# Patient Record
Sex: Female | Born: 1980 | Race: Black or African American | Hispanic: No | Smoking: Current some day smoker
Health system: Southern US, Community
[De-identification: ages and names within clinical notes are randomized; demographics above are authoritative.]

## PROBLEM LIST (undated history)

## (undated) DIAGNOSIS — R4701 Aphasia: Secondary | ICD-10-CM

## (undated) DIAGNOSIS — F419 Anxiety disorder, unspecified: Secondary | ICD-10-CM

## (undated) DIAGNOSIS — I10 Essential (primary) hypertension: Secondary | ICD-10-CM

## (undated) DIAGNOSIS — Z872 Personal history of diseases of the skin and subcutaneous tissue: Secondary | ICD-10-CM

## (undated) DIAGNOSIS — N809 Endometriosis, unspecified: Secondary | ICD-10-CM

## (undated) HISTORY — DX: Essential (primary) hypertension: I10

## (undated) HISTORY — DX: Personal history of diseases of the skin and subcutaneous tissue: Z87.2

## (undated) HISTORY — DX: Endometriosis, unspecified: N80.9

---

## 2004-02-17 ENCOUNTER — Emergency Department: Payer: Self-pay | Admitting: Emergency Medicine

## 2004-02-17 ENCOUNTER — Other Ambulatory Visit: Payer: Self-pay

## 2004-08-14 ENCOUNTER — Emergency Department: Payer: Self-pay | Admitting: Internal Medicine

## 2004-08-14 ENCOUNTER — Other Ambulatory Visit: Payer: Self-pay

## 2005-04-05 ENCOUNTER — Ambulatory Visit (HOSPITAL_COMMUNITY): Admission: RE | Admit: 2005-04-05 | Discharge: 2005-04-05 | Payer: Self-pay | Admitting: Gynecology

## 2005-04-09 HISTORY — PX: OTHER SURGICAL HISTORY: SHX169

## 2005-05-10 ENCOUNTER — Emergency Department: Payer: Self-pay | Admitting: Emergency Medicine

## 2006-04-06 ENCOUNTER — Emergency Department: Payer: Self-pay | Admitting: Emergency Medicine

## 2006-04-18 ENCOUNTER — Ambulatory Visit: Payer: Self-pay | Admitting: Gynecology

## 2006-04-18 ENCOUNTER — Ambulatory Visit (HOSPITAL_COMMUNITY): Admission: RE | Admit: 2006-04-18 | Discharge: 2006-04-18 | Payer: Self-pay | Admitting: Obstetrics & Gynecology

## 2006-05-15 ENCOUNTER — Ambulatory Visit: Payer: Self-pay | Admitting: Obstetrics & Gynecology

## 2006-05-15 ENCOUNTER — Encounter (INDEPENDENT_AMBULATORY_CARE_PROVIDER_SITE_OTHER): Payer: Self-pay | Admitting: Gynecology

## 2006-06-03 ENCOUNTER — Ambulatory Visit (HOSPITAL_COMMUNITY): Admission: RE | Admit: 2006-06-03 | Discharge: 2006-06-03 | Payer: Self-pay | Admitting: Gynecology

## 2006-06-17 ENCOUNTER — Ambulatory Visit: Payer: Self-pay | Admitting: Gynecology

## 2006-07-01 ENCOUNTER — Ambulatory Visit (HOSPITAL_COMMUNITY): Admission: RE | Admit: 2006-07-01 | Discharge: 2006-07-01 | Payer: Self-pay | Admitting: Gynecology

## 2006-07-15 ENCOUNTER — Ambulatory Visit (HOSPITAL_COMMUNITY): Admission: RE | Admit: 2006-07-15 | Discharge: 2006-07-15 | Payer: Self-pay | Admitting: Gynecology

## 2006-07-17 ENCOUNTER — Ambulatory Visit: Payer: Self-pay | Admitting: Obstetrics & Gynecology

## 2006-08-06 ENCOUNTER — Ambulatory Visit: Payer: Self-pay | Admitting: Obstetrics & Gynecology

## 2006-08-13 ENCOUNTER — Ambulatory Visit (HOSPITAL_COMMUNITY): Admission: RE | Admit: 2006-08-13 | Discharge: 2006-08-13 | Payer: Self-pay | Admitting: Gynecology

## 2006-08-25 ENCOUNTER — Ambulatory Visit (HOSPITAL_COMMUNITY): Admission: AD | Admit: 2006-08-25 | Discharge: 2006-08-25 | Payer: Self-pay | Admitting: Obstetrics and Gynecology

## 2006-08-26 ENCOUNTER — Ambulatory Visit: Payer: Self-pay | Admitting: Gynecology

## 2006-09-17 ENCOUNTER — Ambulatory Visit: Payer: Self-pay | Admitting: Family Medicine

## 2006-09-19 ENCOUNTER — Ambulatory Visit: Payer: Self-pay | Admitting: Family Medicine

## 2006-10-03 ENCOUNTER — Ambulatory Visit: Payer: Self-pay | Admitting: Obstetrics & Gynecology

## 2006-10-16 ENCOUNTER — Ambulatory Visit: Payer: Self-pay | Admitting: Obstetrics & Gynecology

## 2006-11-06 ENCOUNTER — Ambulatory Visit: Payer: Self-pay | Admitting: Obstetrics & Gynecology

## 2006-11-14 ENCOUNTER — Ambulatory Visit: Payer: Self-pay | Admitting: Obstetrics & Gynecology

## 2006-11-18 ENCOUNTER — Ambulatory Visit: Payer: Self-pay | Admitting: Gynecology

## 2006-11-27 ENCOUNTER — Ambulatory Visit: Payer: Self-pay | Admitting: Obstetrics & Gynecology

## 2006-11-30 ENCOUNTER — Ambulatory Visit: Payer: Self-pay | Admitting: Physician Assistant

## 2006-11-30 ENCOUNTER — Inpatient Hospital Stay (HOSPITAL_COMMUNITY): Admission: AD | Admit: 2006-11-30 | Discharge: 2006-12-02 | Payer: Self-pay | Admitting: Gynecology

## 2007-07-02 ENCOUNTER — Ambulatory Visit: Payer: Self-pay | Admitting: Gynecology

## 2007-07-03 ENCOUNTER — Ambulatory Visit: Payer: Self-pay | Admitting: Gynecology

## 2007-07-03 ENCOUNTER — Ambulatory Visit (HOSPITAL_COMMUNITY): Admission: RE | Admit: 2007-07-03 | Discharge: 2007-07-03 | Payer: Self-pay | Admitting: Gynecology

## 2007-09-24 ENCOUNTER — Inpatient Hospital Stay (HOSPITAL_COMMUNITY): Admission: AD | Admit: 2007-09-24 | Discharge: 2007-09-24 | Payer: Self-pay | Admitting: Family Medicine

## 2007-10-06 ENCOUNTER — Ambulatory Visit: Payer: Self-pay | Admitting: Gynecology

## 2007-11-11 ENCOUNTER — Emergency Department: Payer: Self-pay

## 2007-11-13 ENCOUNTER — Emergency Department: Payer: Self-pay | Admitting: Emergency Medicine

## 2008-03-11 ENCOUNTER — Encounter: Payer: Self-pay | Admitting: Family Medicine

## 2008-03-11 ENCOUNTER — Ambulatory Visit: Payer: Self-pay | Admitting: Obstetrics & Gynecology

## 2008-03-11 LAB — CONVERTED CEMR LAB
Basophils Absolute: 0 10*3/uL (ref 0.0–0.1)
Hemoglobin: 12.1 g/dL (ref 12.0–15.0)
Hepatitis B Surface Ag: NEGATIVE
Lymphocytes Relative: 34 % (ref 12–46)
Monocytes Relative: 5 % (ref 3–12)
Neutrophils Relative %: 59 % (ref 43–77)
Platelets: 305 10*3/uL (ref 150–400)
Rh Type: POSITIVE
WBC: 8.3 10*3/uL (ref 4.0–10.5)

## 2008-03-12 ENCOUNTER — Ambulatory Visit (HOSPITAL_COMMUNITY): Admission: RE | Admit: 2008-03-12 | Discharge: 2008-03-12 | Payer: Self-pay | Admitting: Obstetrics & Gynecology

## 2008-03-23 ENCOUNTER — Ambulatory Visit: Payer: Self-pay | Admitting: Obstetrics and Gynecology

## 2008-04-20 ENCOUNTER — Ambulatory Visit: Payer: Self-pay | Admitting: Family Medicine

## 2008-04-27 ENCOUNTER — Ambulatory Visit: Payer: Self-pay | Admitting: Obstetrics and Gynecology

## 2008-05-16 ENCOUNTER — Emergency Department: Payer: Self-pay | Admitting: Emergency Medicine

## 2008-05-17 ENCOUNTER — Ambulatory Visit: Payer: Self-pay | Admitting: Obstetrics and Gynecology

## 2008-06-01 ENCOUNTER — Ambulatory Visit (HOSPITAL_COMMUNITY): Admission: RE | Admit: 2008-06-01 | Discharge: 2008-06-01 | Payer: Self-pay | Admitting: Family Medicine

## 2008-06-07 ENCOUNTER — Inpatient Hospital Stay (HOSPITAL_COMMUNITY): Admission: AD | Admit: 2008-06-07 | Discharge: 2008-06-07 | Payer: Self-pay | Admitting: Obstetrics & Gynecology

## 2008-06-09 ENCOUNTER — Encounter: Payer: Self-pay | Admitting: Family Medicine

## 2008-06-09 ENCOUNTER — Ambulatory Visit: Payer: Self-pay | Admitting: Obstetrics & Gynecology

## 2008-07-06 ENCOUNTER — Ambulatory Visit: Payer: Self-pay | Admitting: Obstetrics and Gynecology

## 2008-07-11 ENCOUNTER — Ambulatory Visit: Payer: Self-pay | Admitting: Family Medicine

## 2008-07-11 ENCOUNTER — Inpatient Hospital Stay (HOSPITAL_COMMUNITY): Admission: AD | Admit: 2008-07-11 | Discharge: 2008-07-11 | Payer: Self-pay | Admitting: Family Medicine

## 2008-07-16 ENCOUNTER — Inpatient Hospital Stay (HOSPITAL_COMMUNITY): Admission: AD | Admit: 2008-07-16 | Discharge: 2008-07-16 | Payer: Self-pay | Admitting: Obstetrics & Gynecology

## 2008-08-04 ENCOUNTER — Ambulatory Visit: Payer: Self-pay | Admitting: Family Medicine

## 2008-08-04 LAB — CONVERTED CEMR LAB
HCT: 31.4 % — ABNORMAL LOW (ref 36.0–46.0)
Hemoglobin: 10.5 g/dL — ABNORMAL LOW (ref 12.0–15.0)
MCHC: 33.4 g/dL (ref 30.0–36.0)
Platelets: 259 10*3/uL (ref 150–400)
RBC: 3.38 M/uL — ABNORMAL LOW (ref 3.87–5.11)
RDW: 13.2 % (ref 11.5–15.5)
WBC: 11 10*3/uL — ABNORMAL HIGH (ref 4.0–10.5)

## 2008-08-18 ENCOUNTER — Ambulatory Visit: Payer: Self-pay | Admitting: Obstetrics & Gynecology

## 2008-09-04 ENCOUNTER — Inpatient Hospital Stay (HOSPITAL_COMMUNITY): Admission: AD | Admit: 2008-09-04 | Discharge: 2008-09-04 | Payer: Self-pay | Admitting: Obstetrics & Gynecology

## 2008-09-04 ENCOUNTER — Ambulatory Visit: Payer: Self-pay | Admitting: Advanced Practice Midwife

## 2008-09-07 ENCOUNTER — Ambulatory Visit: Payer: Self-pay | Admitting: Obstetrics & Gynecology

## 2008-09-09 ENCOUNTER — Ambulatory Visit (HOSPITAL_COMMUNITY): Admission: RE | Admit: 2008-09-09 | Discharge: 2008-09-09 | Payer: Self-pay | Admitting: Obstetrics & Gynecology

## 2008-09-13 ENCOUNTER — Ambulatory Visit: Payer: Self-pay | Admitting: Obstetrics & Gynecology

## 2008-09-27 ENCOUNTER — Inpatient Hospital Stay (HOSPITAL_COMMUNITY): Admission: AD | Admit: 2008-09-27 | Discharge: 2008-09-27 | Payer: Self-pay | Admitting: Obstetrics & Gynecology

## 2008-09-28 ENCOUNTER — Encounter: Payer: Self-pay | Admitting: Obstetrics and Gynecology

## 2008-09-28 ENCOUNTER — Ambulatory Visit: Payer: Self-pay | Admitting: Obstetrics & Gynecology

## 2008-09-28 LAB — CONVERTED CEMR LAB: Chlamydia, DNA Probe: NEGATIVE

## 2008-09-29 ENCOUNTER — Encounter: Payer: Self-pay | Admitting: Obstetrics and Gynecology

## 2008-09-30 ENCOUNTER — Ambulatory Visit: Payer: Self-pay | Admitting: Obstetrics & Gynecology

## 2008-10-04 ENCOUNTER — Ambulatory Visit (HOSPITAL_COMMUNITY): Admission: RE | Admit: 2008-10-04 | Discharge: 2008-10-04 | Payer: Self-pay | Admitting: Family Medicine

## 2008-10-06 ENCOUNTER — Inpatient Hospital Stay (HOSPITAL_COMMUNITY): Admission: AD | Admit: 2008-10-06 | Discharge: 2008-10-08 | Payer: Self-pay | Admitting: Obstetrics & Gynecology

## 2008-10-06 ENCOUNTER — Ambulatory Visit: Payer: Self-pay | Admitting: Family

## 2008-10-06 DIAGNOSIS — IMO0002 Reserved for concepts with insufficient information to code with codable children: Secondary | ICD-10-CM

## 2010-07-17 LAB — CBC
HCT: 32 % — ABNORMAL LOW (ref 36.0–46.0)
Hemoglobin: 11.2 g/dL — ABNORMAL LOW (ref 12.0–15.0)
MCV: 94.6 fL (ref 78.0–100.0)
RDW: 13.3 % (ref 11.5–15.5)
WBC: 12.6 10*3/uL — ABNORMAL HIGH (ref 4.0–10.5)

## 2010-07-19 LAB — URINE CULTURE: Colony Count: 8000

## 2010-07-19 LAB — URINALYSIS, ROUTINE W REFLEX MICROSCOPIC
Bilirubin Urine: NEGATIVE
Glucose, UA: NEGATIVE mg/dL
Hgb urine dipstick: NEGATIVE
Ketones, ur: NEGATIVE mg/dL
Leukocytes, UA: NEGATIVE
Nitrite: NEGATIVE
Urobilinogen, UA: 1 mg/dL (ref 0.0–1.0)

## 2010-07-19 LAB — URINE MICROSCOPIC-ADD ON

## 2010-07-19 LAB — WET PREP, GENITAL: Trich, Wet Prep: NONE SEEN

## 2010-07-19 LAB — GC/CHLAMYDIA PROBE AMP, GENITAL: Chlamydia, DNA Probe: NEGATIVE

## 2010-07-20 LAB — CBC
HCT: 34 % — ABNORMAL LOW (ref 36.0–46.0)
Hemoglobin: 11.4 g/dL — ABNORMAL LOW (ref 12.0–15.0)
MCHC: 33.7 g/dL (ref 30.0–36.0)
MCV: 93.2 fL (ref 78.0–100.0)
Platelets: 255 10*3/uL (ref 150–400)
RDW: 12.8 % (ref 11.5–15.5)

## 2010-07-20 LAB — WET PREP, GENITAL

## 2010-07-20 LAB — URINE CULTURE: Colony Count: 25000

## 2010-07-20 LAB — URINALYSIS, ROUTINE W REFLEX MICROSCOPIC
Bilirubin Urine: NEGATIVE
Hgb urine dipstick: NEGATIVE
Ketones, ur: NEGATIVE mg/dL
Nitrite: NEGATIVE
Protein, ur: NEGATIVE mg/dL
Urobilinogen, UA: 0.2 mg/dL (ref 0.0–1.0)

## 2010-07-20 LAB — URINE MICROSCOPIC-ADD ON

## 2010-08-22 NOTE — Assessment & Plan Note (Signed)
Kara Brooks, LOSE NO.:  [id number]   MEDICAL RECORD NO.:  [id number]          PATIENT TYPE:  POB   LOCATION:  [facility] at [facility]         FACILITY:  [facility]   PHYSICIAN:  Ginger Carne, MD DATE OF BIRTH:  October 08, 1980   DATE OF SERVICE:  10/06/2007                                  CLINIC NOTE   Ms. Hagenow is a 30 year old African American female who has been on Depo-  Provera since August 2008 after delivery of her last child.  She has  actually been on said form of contraception for years prior to her  having a child.  Over the past 25 days, she has experienced scant  vaginal bleeding described as old dark blood and requiring a panty  liner.  She has not had this problem in the past.   SALIENT PHYSICAL FINDINGS:  VITAL SIGNS: Blood pressure 132/82, weight  174 pounds, height 5 feet 6 inches, and pulse 79 and regular.  EXTERNAL GENITALIA: Vulva and vagina normal.  Cervix smooth without  erosions or lesions.  Uterus is small, anteverted, and flexed.  Both  adnexa are palpable and found to be normal.  Urine pregnancy test  negative.   IMPRESSION:  Abnormal uterine bleeding secondary to Depo-Provera  injection.   PLAN:  The patient received her last injection in April 2009.  She  states that she does not wish to continue said medication, and she  wishes to try to have another child in the future.  I have prescribed  Premarin 1.25 mg daily for 7 days.  It was explained to the patient that  this may not completely control her bleeding and it is possible she will  have scant flow off and on over the next month or two.  She also  understands that although the contraceptive effect of Depo-Provera is 3  months, the actual side effects including spotting may last up to 9  months to a year.  The patient understands this and will try Premarin.           ______________________________  Ginger Carne, MD     SHB/MEDQ  D:  10/06/2007  T:  10/07/2007  Job:   [id number]

## 2010-08-22 NOTE — Op Note (Signed)
Kara Brooks, Kara Brooks              ACCOUNT NO.:  [id number]   MEDICAL RECORD NO.:  [id number]          PATIENT TYPE:  AMB   LOCATION:  [facility]                           [facility]ACILITY:  [facility]   PHYSICIAN:  Ginger Carne, MD  DATE OF BIRTH:  [birth year]   DATE OF PROCEDURE:  07/03/2007  DATE OF DISCHARGE:                               OPERATIVE REPORT   PREOPERATIVE DIAGNOSES:  Right upper groin abscess.   POSTOPERATIVE DIAGNOSES:  Right upper groin abscess.   PROCEDURE:  Incision and drainage of right groin abscess.   SURGEON:  Ginger Carne, MD.   ASSISTANT:  None.   COMPLICATIONS:  None immediate.   ESTIMATED BLOOD LOSS:  Minimal.   SPECIMEN:  Aerobic and anaerobic cultures.   ANESTHESIA:  MAC.   OPERATIVE FINDINGS:  A 4 x 2 cm oblong right upper groin abscess which  demonstrated purulent and sebaceous material.   DESCRIPTION OF PROCEDURE:  The patient prepped and draped in the usual  fashion and placed in lithotomy position.  Betadine solution used for  antiseptic and the patient was not catheterized.  After adequate MAC  anesthesia, an incision was made in the abscess site. Purulent and  sebaceous material was obtained and cultured for anaerobic and aerobic  bacteria. Inspection of the abscess with a Kelly clamp was performed to  assure that all infective material was removed.  Minimal bleeding noted  at the end of the procedure.  The patient tolerated procedure the  procedure well and returned to the post anesthesia recovery room in  excellent condition.      Ginger Carne, MD  Electronically Signed     SHB/MEDQ  D:  07/03/2007  T:  07/04/2007  Job:  [id number]

## 2010-08-22 NOTE — H&P (Signed)
NAMEKENZLIE, DISCH              ACCOUNT NO.:  [id number]   MEDICAL RECORD NO.:  [id number]          PATIENT TYPE:  POB   LOCATION:  [facility]                          FACILITY:  [facility]   PHYSICIAN:  Ginger Carne, MD  DATE OF BIRTH:  18-Sep-1980   DATE OF ADMISSION:  07/02/2007  DATE OF DISCHARGE:                              HISTORY & PHYSICAL   Surgery scheduled July 03, 2007.   REASON FOR HOSPITALIZATION:  Right groin abscess.   HISTORY OF PRESENT ILLNESS:  This patient is a 30 year old gravida 1,  para 1 African American female who has had, by history, presumptive  diagnosis of hidradenitis suppurativa.  Over the past month she has had  an enlarging and tender upper right inner thigh caruncle, which is not  spontaneously drained.   OB/GYN HISTORY:  The patient has had 1 vaginal delivery in the past.   PAST SURGICAL HISTORY:  She has had a operative laparoscopy in December  2006.   PAST MEDICAL HISTORY:  None.   ALLERGIES:  METRONIDAZOLE which causes nausea and vomiting.   CURRENT MEDICATIONS:  [place] vitamins, Depo Provera q. 3 months as  needed for contraception.   SOCIAL HISTORY:  The patient smokes 1/2 to 1 pack of cigarettes daily.  Denies alcohol or illicit drug abuse.   FAMILY HISTORY:  No first-degree relatives with breast, colon, ovarian,  or uterine carcinoma.  Her mother has hypertension.   REVIEW OF SYSTEMS:  A 14 point comprehensive review of systems within  normal limits.   PHYSICAL EXAMINATION:  Blood pressure 139/86, weight 174 pounds, height  5 feet 6 inches.  HEENT: Grossly normal.  Breast exam without masses, discharge, thickenings or tenderness.  CHEST:  Clear to percussion and auscultation.  CARDIOVASCULAR:  Exam without murmurs or enlargements.  Regular rate and  rhythm.  EXTREMITY, LYMPHATIC SKIN, NEUROLOGICAL, MUSCULOSKELETAL:  Systems all  within normal limits.  ABDOMEN:  Soft without gross hepatosplenomegaly.  PELVIC: Exam, external  genitalia, vulva and vagina reveals a 4x2 oblong  caruncle in the upper inner right thigh.  It is tender on palpation and  fluctuant.  The patient demonstrates previous scarring from a caruncles.  Uterus small, anteverted, flexed.  Both adnexa palpable found to be  normal.   IMPRESSION:  Right inner thigh caruncle.   PLAN:  The patient declined in office incision and drainage under local,  and is therefore to be admitted under short-stay status for incision and  drainage of right inner thigh, caruncle, anaerobic and aerobic cultures  will be obtained and the patient will be placed afterwards on Bactrim  double strength 1 twice a day for 14 days.      Ginger Carne, MD  Electronically Signed     SHB/MEDQ  D:  07/02/2007  T:  07/02/2007  Job:  [id number]

## 2010-08-25 NOTE — H&P (Signed)
Kara Brooks, TRANCHINA              ACCOUNT NO.:  [id number]   MEDICAL RECORD NO.:  [id number]          PATIENT TYPE:  AMB   LOCATION:  [facility]                           [facility]AC[facility]   PHYSICIAN:  Ginger Carne, MD  DATE OF BIRTH:  01/30/1981   DATE OF ADMISSION:  04/05/2005  DATE OF DISCHARGE:                                HISTORY & PHYSICAL   REASON FOR HOSPITALIZATION:  Severe dysmenorrhea and chronic pelvic pain.   IN-HOSPITAL PROCEDURES:  Operative laparoscopy and hydrotubation.   HISTORY OF PRESENT ILLNESS:  This is a 30 year old nulligravida female  admitted for an operative laparoscopy and hydrotubation because of  significant dysmenorrhea and chronic pelvic pain with dyspareunia.  The  patient had utilized Depo Provera over time which had minimized her  discomfort.  Since 2004, however, she has been off Depo Provera.  Her  discomfort has been managed with nonsteroidal anti-inflammatory agents with  modest relief.  The patient states that she has significant discomfort  lasting through her menses in addition to intramenstrual cramping and  occasional dyspareunia.  She has utilized Vicodin for discomfort which has  helped her deal with her pain.  The patient has no gastrointestinal,  genitourinary or musculoskeletal sources for her discomfort.   Discomfort has occurred over the past three to four years, but has worsened  significantly over the past six to eight months.   OB/GYN HISTORY:  The patient has never been pregnant and has had no prior  gynecological surgery.   ALLERGIES:  None.   CURRENT MEDICATIONS:  Lo-Estrin 24 oral contraceptives and Vicodin.   SOCIAL HISTORY:  Patient smokes about half pack of cigarettes daily.  Denies  alcohol or drug abuse.   FAMILY HISTORY:  Mother has hypertension.   REVIEW OF SYSTEMS:  Negative.   MEDICAL HISTORY:  Negative.   SURGICAL HISTORY:  Negative.   PHYSICAL EXAMINATION:  VITAL SIGNS:  Blood pressure is 129/84,  height 5 feet  7 inches, weight 169 pounds.  HEENT:  Grossly normal.  CHEST:  Clear to auscultation, percussion.  CARDIAC:  Without murmurs or enlargements.  Regular rate and rhythm.  BREASTS:  Without masses, discharge, thickenings, or tenderness.  EXTREMITIES:  Normal.  LYMPHATIC:  Normal.  NEUROLOGIC:  Normal.  MUSCULOSKELETAL:  Normal.  ABDOMEN:  Soft without gross hepatosplenomegaly.  PELVIC:  Normal Pap smear.  Urinalysis negative.  External genitalia, vulva,  vagina normal.  Cervix smooth without erosions or lesions.  Uterus is small,  anteverted, and flexed.  No nodularity in the cul-de-sac noted.  Both adnexa  palpable, found to be normal.  RECTAL:  Hemoccult-negative without masses.   IMPRESSION:  Chronic dysmenorrhea with dyspareunia and intramenstrual  cramping.   PLAN:  Patient will undergo an operative laparoscopy with hydrotubation to  assess the etiology of her pelvic pain.  Ashby Dawes of said procedure discussed  in detail with the patient.      Ginger Carne, MD  Electronically Signed     SHB/MEDQ  D:  04/04/2005  T:  04/04/2005  Job:  [id number]

## 2010-08-25 NOTE — Op Note (Signed)
Kara Brooks, Kara Brooks              ACCOUNT NO.:  [id number]   MEDICAL RECORD NO.:  [id number]          PATIENT TYPE:  AMB   LOCATION:  [facility]                           [facility]   PHYSICIAN:  Ginger Carne, MD  DATE OF BIRTH:  1980/10/03   DATE OF PROCEDURE:  04/05/2005  DATE OF DISCHARGE:                                 OPERATIVE REPORT   PREOPERATIVE DIAGNOSIS:  Chronic pelvic pain.   POSTOPERATIVE DIAGNOSIS:  Stage III endometriosis, chronic pelvic  inflammatory disease, bilateral adnexal adhesions, and bilateral tubal  occlusion.   PROCEDURE:  Diagnostic laparoscopy, hydrotubation.   SURGEON:  Ginger Carne, M.D.   ASSISTANT:  None.   COMPLICATIONS:  None immediate.   ESTIMATED BLOOD LOSS:  Minimal.   SPECIMEN:  None.   ANESTHESIA:  General.   OPERATIVE FINDINGS:  External genitalia, vulva, vagina normal. Cervix smooth  without erosions or lesions. Laparoscopic evaluation revealed a normal  contoured uterus but with endometriotic flecks on the posterior aspect of  the uterus and cervix. Both adnexa were involved with adhesive disease and  endometriosis. Both tubes and ovaries had evidence for adhesive disease.  Hydrotubation revealed no evidence of spillage of dye through said tubes and  the end of the tubes appeared occluded. The left adnexa was significantly  adherent more so than the right. The appendix was similarly involved in  adhesive disease. Large and small bowel grossly normal. Upper abdomen  normal.   OPERATIVE PROCEDURE:  The patient prepped and draped in the usual fashion  and placed in lithotomy position. Betadine solution used for antiseptic and  the patient was catheterized prior to the procedure. After adequate general  anesthesia, tenaculum placed on the ends of the cervix and acorn tenaculum  placed in the endocervical canal. Following this a vertical infraumbilical  incision was made. The Veress needle placed in the abdomen. Opening  and  closing pressures were 10-50 mmHg. Needle released, trocar placed through  the same incision. Laparoscope placed through the trocar sleeve. The 5 mm  port was made in the left lower quadrant under direct visualization.  Inspection and photography of the abdomen and pelvis was conducted.  Hydrotubation performed. Afterwards gas released,  trocars removed. Closure of the 10 mm fascia site with 0 Vicryl suture and a  4-0 Vicryl for subcuticular closure. Instrument and sponge count were  correct. The patient tolerated procedure well, returned to post anesthesia  recovery room in excellent condition.      Ginger Carne, MD  Electronically Signed     SHB/MEDQ  D:  04/05/2005  T:  04/05/2005  Job:  [id number]

## 2011-01-01 LAB — WOUND CULTURE

## 2011-01-01 LAB — CBC
Hemoglobin: 13.1
MCV: 88.4
RBC: 4.29
WBC: 8.3

## 2011-01-01 LAB — BASIC METABOLIC PANEL
Calcium: 9.8
Chloride: 107
Creatinine, Ser: 0.69
GFR calc Af Amer: >60
Sodium: 140

## 2011-01-01 LAB — ANAEROBIC CULTURE

## 2011-01-19 LAB — URINALYSIS, DIPSTICK ONLY
Hgb urine dipstick: NEGATIVE
Leukocytes, UA: NEGATIVE
Protein, ur: NEGATIVE
Urobilinogen, UA: 1

## 2011-01-19 LAB — CBC
HCT: 29.4 — ABNORMAL LOW
Hemoglobin: 12.7
MCHC: 34.4
MCV: 92.6
MCV: 93.2
Platelets: 203
Platelets: 211
RBC: 3.96
WBC: 14.9 — ABNORMAL HIGH
WBC: 23.4 — ABNORMAL HIGH

## 2011-01-19 LAB — COMPREHENSIVE METABOLIC PANEL
Alkaline Phosphatase: 107
BUN: 6
CO2: 21
Chloride: 105
GFR calc non Af Amer: >60
Glucose, Bld: 75
Potassium: 3.7
Total Bilirubin: 0.6

## 2011-01-19 LAB — DIFFERENTIAL
Eosinophils Relative: 1
Lymphocytes Relative: 16
Lymphs Abs: 2.4

## 2011-01-19 LAB — URIC ACID: Uric Acid, Serum: 4.7

## 2011-01-19 LAB — LACTATE DEHYDROGENASE: LDH: 129

## 2011-03-06 ENCOUNTER — Emergency Department: Payer: Self-pay | Admitting: Emergency Medicine

## 2011-03-10 ENCOUNTER — Emergency Department: Payer: Self-pay | Admitting: Emergency Medicine

## 2012-04-07 ENCOUNTER — Emergency Department: Payer: Self-pay | Admitting: Emergency Medicine

## 2013-07-08 ENCOUNTER — Emergency Department: Payer: Self-pay | Admitting: Emergency Medicine

## 2013-07-11 LAB — BETA STREP CULTURE(ARMC)

## 2014-01-15 ENCOUNTER — Emergency Department: Payer: Self-pay | Admitting: Student

## 2014-01-16 ENCOUNTER — Emergency Department: Payer: Self-pay | Admitting: Emergency Medicine

## 2014-03-02 ENCOUNTER — Emergency Department: Payer: Self-pay | Admitting: Emergency Medicine

## 2014-04-09 DIAGNOSIS — Z872 Personal history of diseases of the skin and subcutaneous tissue: Secondary | ICD-10-CM

## 2014-04-09 HISTORY — PX: OTHER SURGICAL HISTORY: SHX169

## 2014-04-09 HISTORY — DX: Personal history of diseases of the skin and subcutaneous tissue: Z87.2

## 2015-04-10 NOTE — L&D Delivery Note (Signed)
Delivery Note At 3:43 PM a viable female was delivered via Vaginal, Spontaneous Delivery (Presentation: ROA ).  APGAR: 8, 9; weight 3 lb 5.3 oz (1510 g).  Infant dried; cord clamped and cut and infant taken to awaiting peds team for eval.  Placenta status: spont, intact- to path .  Cord:  3 vessels Anesthesia:   Episiotomy: None Lacerations: None Est. Blood Loss (mL): 150  Mom to postpartum.  Baby to NICU.  Cam HaiSHAW, Asiya Cutbirth CNM 11/18/2015, 4:36 PM

## 2015-04-26 ENCOUNTER — Emergency Department
Admission: EM | Admit: 2015-04-26 | Discharge: 2015-04-26 | Disposition: A | Discharge status: Home / Self Care | Payer: Self-pay

## 2015-05-03 ENCOUNTER — Encounter: Payer: Self-pay | Admitting: Obstetrics & Gynecology

## 2015-05-03 ENCOUNTER — Ambulatory Visit (INDEPENDENT_AMBULATORY_CARE_PROVIDER_SITE_OTHER): Payer: Medicaid Other | Admitting: Obstetrics & Gynecology

## 2015-05-03 VITALS — BP 137/83 | HR 98 | Ht 67.0 in | Wt 178.0 lb

## 2015-05-03 DIAGNOSIS — Z3490 Encounter for supervision of normal pregnancy, unspecified, unspecified trimester: Secondary | ICD-10-CM

## 2015-05-03 DIAGNOSIS — O3680X1 Pregnancy with inconclusive fetal viability, fetus 1: Secondary | ICD-10-CM | POA: Diagnosis not present

## 2015-05-03 DIAGNOSIS — Z23 Encounter for immunization: Secondary | ICD-10-CM | POA: Diagnosis not present

## 2015-05-03 NOTE — Progress Notes (Signed)
Bedside US shows Gestational Sac and Yolk Sac measuring approx 0.196cm - Pt needs another scan in 2 weeks to measure gestational age.

## 2015-05-03 NOTE — Progress Notes (Signed)
This young lady is here for a NOB visit but her bedside u/s shows only a yolk sac. She denies VB or any other problems other than some nausea. She will RTC in 2 weeks for u/s and NOB visit.

## 2015-05-17 ENCOUNTER — Other Ambulatory Visit (HOSPITAL_COMMUNITY)
Admission: RE | Admit: 2015-05-17 | Discharge: 2015-05-17 | Disposition: A | Discharge status: Home / Self Care | Payer: Medicaid Other | Source: Ambulatory Visit | Attending: Obstetrics & Gynecology | Admitting: Obstetrics & Gynecology

## 2015-05-17 ENCOUNTER — Ambulatory Visit (INDEPENDENT_AMBULATORY_CARE_PROVIDER_SITE_OTHER): Payer: Medicaid Other | Admitting: Obstetrics & Gynecology

## 2015-05-17 ENCOUNTER — Encounter: Payer: Self-pay | Admitting: Obstetrics & Gynecology

## 2015-05-17 VITALS — BP 135/84 | HR 91 | Wt 181.0 lb

## 2015-05-17 DIAGNOSIS — Z3480 Encounter for supervision of other normal pregnancy, unspecified trimester: Secondary | ICD-10-CM

## 2015-05-17 DIAGNOSIS — Z36 Encounter for antenatal screening of mother: Secondary | ICD-10-CM

## 2015-05-17 DIAGNOSIS — O09529 Supervision of elderly multigravida, unspecified trimester: Secondary | ICD-10-CM | POA: Insufficient documentation

## 2015-05-17 DIAGNOSIS — Z01419 Encounter for gynecological examination (general) (routine) without abnormal findings: Secondary | ICD-10-CM | POA: Insufficient documentation

## 2015-05-17 DIAGNOSIS — Z1151 Encounter for screening for human papillomavirus (HPV): Secondary | ICD-10-CM | POA: Diagnosis not present

## 2015-05-17 DIAGNOSIS — O09521 Supervision of elderly multigravida, first trimester: Secondary | ICD-10-CM

## 2015-05-17 DIAGNOSIS — Z3481 Encounter for supervision of other normal pregnancy, first trimester: Secondary | ICD-10-CM

## 2015-05-17 DIAGNOSIS — Z113 Encounter for screening for infections with a predominantly sexual mode of transmission: Secondary | ICD-10-CM | POA: Diagnosis present

## 2015-05-17 DIAGNOSIS — Z3491 Encounter for supervision of normal pregnancy, unspecified, first trimester: Secondary | ICD-10-CM

## 2015-05-17 DIAGNOSIS — O0993 Supervision of high risk pregnancy, unspecified, third trimester: Secondary | ICD-10-CM | POA: Insufficient documentation

## 2015-05-17 NOTE — Progress Notes (Signed)
Bedside ultrasound today measures [redacted]w[redacted]d fetus with heartbeat.  

## 2015-05-17 NOTE — Progress Notes (Signed)
   Subjective:    Kara Brooks is a AA [id number] [redacted]w[redacted]d being seen today for her first obstetrical visit.  Her obstetrical history is significant for advanced maternal age. Patient does intend to breast feed. Pregnancy history fully reviewed.  Patient reports no complaints.  Filed Vitals:   05/17/15 1357  BP: 135/84  Pulse: 91  Weight: 181 lb (82.101 kg)    HISTORY: OB History  Gravida Para Term Preterm AB SAB TAB Ectopic Multiple Living  # Outcome Date GA Lbr Len/2nd Weight Sex Delivery Anes PTL Lv  3 Current           2 Preterm 10/06/08 [redacted]w[redacted]d  4 lb 6 oz (1.984 kg) F Vag-Spont  N Y     Complications: IUGR (intrauterine growth restriction)  1 Term 11/30/06 [redacted]w[redacted]d  7 lb 8 oz (3.402 kg) F Vag-Spont   Y     Complications: Chorioamnionitis     Past Medical History  Diagnosis Date  . Hypertension   . History of hidradenitis suppurativa 2016  . Endometriosis    Past Surgical History  Procedure Laterality Date  . Laprascopy  2007  . Sweat gland removal  2016   Family History  Problem Relation Age of Onset  . Hypertension Mother   . Diabetes Mother   . Depression Mother      Exam    Uterus:     Pelvic Exam:    Perineum: No Hemorrhoids   Vulva: normal   Vagina:  normal mucosa   pH:    Cervix: anteverted   Adnexa: normal adnexa   Bony Pelvis: android  System: Breast:  normal appearance, no masses or tenderness   Skin: normal coloration and turgor, no rashes    Neurologic: oriented   Extremities: normal strength, tone, and muscle mass   HEENT PERRLA   Mouth/Teeth mucous membranes moist, pharynx normal without lesions   Neck supple   Cardiovascular: regular rate and rhythm   Respiratory:  appears well, vitals normal, no respiratory distress, acyanotic, normal RR, ear and throat exam is normal, neck free of mass or lymphadenopathy, chest clear, no wheezing, crepitations, rhonchi, normal symmetric air entry   Abdomen: soft, non-tender; bowel  sounds normal; no masses,  no organomegaly   Urinary: urethral meatus normal      Assessment:    Pregnancy: [id number] There are no active problems to display for this patient.       Plan:     Initial labs drawn. Prenatal vitamins. Problem list reviewed and updated. Genetic Screening discussed First Screen: declined. She says that she would not terminate for a genetic abnormality and that she would not want to know prior to delivery. She is aware that she is considered AMA.  Ultrasound discussed; fetal survey: requested.  Follow up in 4 weeks. Rec 20 pound weight gain  Joi Leyva C. 05/17/2015

## 2015-05-18 LAB — PRENATAL PROFILE (SOLSTAS)
Antibody Screen: NEGATIVE
BASOS ABS: 0 10*3/uL (ref 0.0–0.1)
Basophils Relative: 0 % (ref 0–1)
EOS PCT: 1 % (ref 0–5)
Eosinophils Absolute: 0.1 10*3/uL (ref 0.0–0.7)
HCT: 34.9 % — ABNORMAL LOW (ref 36.0–46.0)
HEP B S AG: NEGATIVE
HIV: NONREACTIVE
Hemoglobin: 11.7 g/dL — ABNORMAL LOW (ref 12.0–15.0)
LYMPHS ABS: 3.3 10*3/uL (ref 0.7–4.0)
LYMPHS PCT: 28 % (ref 12–46)
MCH: 30.5 pg (ref 26.0–34.0)
MCHC: 33.5 g/dL (ref 30.0–36.0)
MCV: 91.1 fL (ref 78.0–100.0)
MPV: 10.5 fL (ref 8.6–12.4)
Monocytes Absolute: 0.6 10*3/uL (ref 0.1–1.0)
Monocytes Relative: 5 % (ref 3–12)
NEUTROS PCT: 66 % (ref 43–77)
Neutro Abs: 7.9 10*3/uL — ABNORMAL HIGH (ref 1.7–7.7)
PLATELETS: 330 10*3/uL (ref 150–400)
RBC: 3.83 MIL/uL — ABNORMAL LOW (ref 3.87–5.11)
RDW: 12.8 % (ref 11.5–15.5)
Rh Type: POSITIVE
Rubella: 2.8 Index — ABNORMAL HIGH (ref <0.90)
WBC: 11.9 10*3/uL — ABNORMAL HIGH (ref 4.0–10.5)

## 2015-05-18 LAB — CYTOLOGY - PAP

## 2015-05-19 ENCOUNTER — Encounter: Payer: Self-pay | Admitting: *Deleted

## 2015-05-19 LAB — CULTURE, OB URINE

## 2015-05-30 ENCOUNTER — Encounter: Payer: Self-pay | Admitting: *Deleted

## 2015-05-31 ENCOUNTER — Encounter: Payer: Self-pay | Admitting: Obstetrics and Gynecology

## 2015-06-15 ENCOUNTER — Ambulatory Visit (INDEPENDENT_AMBULATORY_CARE_PROVIDER_SITE_OTHER): Payer: Medicaid Other | Admitting: Certified Nurse Midwife

## 2015-06-15 ENCOUNTER — Encounter (INDEPENDENT_AMBULATORY_CARE_PROVIDER_SITE_OTHER): Payer: Self-pay

## 2015-06-15 VITALS — BP 123/79 | HR 108 | Wt 176.0 lb

## 2015-06-15 DIAGNOSIS — B3731 Acute candidiasis of vulva and vagina: Secondary | ICD-10-CM

## 2015-06-15 DIAGNOSIS — B373 Candidiasis of vulva and vagina: Secondary | ICD-10-CM | POA: Diagnosis not present

## 2015-06-15 DIAGNOSIS — Z3481 Encounter for supervision of other normal pregnancy, first trimester: Secondary | ICD-10-CM

## 2015-06-15 DIAGNOSIS — O09521 Supervision of elderly multigravida, first trimester: Secondary | ICD-10-CM

## 2015-06-15 DIAGNOSIS — N898 Other specified noninflammatory disorders of vagina: Secondary | ICD-10-CM

## 2015-06-15 NOTE — Progress Notes (Signed)
Subjective:  Kara Brooks is a 35 y.o. G3P1102 at 7465w5d being seen today for ongoing prenatal care.  She is currently monitored for the following issues for this high-risk pregnancy and has Supervision of other normal pregnancy, antepartum and AMA (advanced maternal age) multigravida 35+ on her problem list.  Patient reports vaginal discharge.  Contractions: Not present. Vag. Bleeding: None.  Movement: Absent. Denies leaking of fluid.   The following portions of the patient's history were reviewed and updated as appropriate: allergies, current medications, past family history, past medical history, past social history, past surgical history and problem list. Problem list updated.  Objective:   Filed Vitals:   06/15/15 1311  BP: 123/79  Pulse: 108  Weight: 176 lb (79.833 kg)    Fetal Status: Fetal Heart Rate (bpm): + on U/S   Movement: Absent     General:  Alert, oriented and cooperative. Patient is in no acute distress.  Skin: Skin is warm and dry. No rash noted.   Cardiovascular: Normal heart rate noted  Respiratory: Normal respiratory effort, no problems with respiration noted  Abdomen: Soft, gravid, appropriate for gestational age. Pain/Pressure: Absent     Pelvic: Vag. Bleeding: None Vag D/C Character: White   Cervical exam deferred        Extremities: Normal range of motion.  Edema: None  Mental Status: Normal mood and affect. Normal behavior. Normal judgment and thought content.   Urinalysis: Urine Protein: Negative Urine Glucose: Negative  Assessment and Plan:  Pregnancy: G3P1102 at 6965w5d  1. Vaginal discharge  - WET PREP FOR TRICH, YEAST, CLUE  2. AMA (advanced maternal age) multigravida 35+, first trimester   3. Supervision of other normal pregnancy, antepartum, first trimester   4. Yeast infection of the vagina   Preterm labor symptoms and general obstetric precautions including but not limited to vaginal bleeding, contractions, leaking of fluid and fetal  movement were reviewed in detail with the patient. Please refer to After Visit Summary for other counseling recommendations.  Return in about 4 weeks (around 07/13/2015).   Rhea PinkLori A Clemmons, CNM

## 2015-06-15 NOTE — Progress Notes (Signed)
Pt having white, itching d/c

## 2015-06-15 NOTE — Patient Instructions (Signed)

## 2015-06-16 ENCOUNTER — Telehealth: Payer: Self-pay | Admitting: *Deleted

## 2015-06-16 DIAGNOSIS — N898 Other specified noninflammatory disorders of vagina: Secondary | ICD-10-CM

## 2015-06-16 DIAGNOSIS — O26891 Other specified pregnancy related conditions, first trimester: Principal | ICD-10-CM

## 2015-06-16 LAB — WET PREP FOR TRICH, YEAST, CLUE: Trich, Wet Prep: NONE SEEN

## 2015-06-16 MED ORDER — TERCONAZOLE 0.4 % VA CREA: 1.0000 | TOPICAL_CREAM | Freq: Every day | VAGINAL | Status: DC

## 2015-06-16 MED ORDER — CLINDAMYCIN PHOSPHATE 2 % VA CREA: 1.0000 | TOPICAL_CREAM | Freq: Every day | VAGINAL | Status: DC

## 2015-06-16 NOTE — Telephone Encounter (Signed)
Patient called for her lab results.  Notified patient of positive BV and yeast.  Rx routed to pharmacy verbal order from Dr. Shawnie PonsPratt.

## 2015-06-29 ENCOUNTER — Ambulatory Visit (INDEPENDENT_AMBULATORY_CARE_PROVIDER_SITE_OTHER): Payer: Medicaid Other | Admitting: Certified Nurse Midwife

## 2015-06-29 VITALS — BP 116/77 | HR 103 | Wt 173.0 lb

## 2015-06-29 DIAGNOSIS — O09522 Supervision of elderly multigravida, second trimester: Secondary | ICD-10-CM

## 2015-06-29 DIAGNOSIS — Z3482 Encounter for supervision of other normal pregnancy, second trimester: Secondary | ICD-10-CM

## 2015-06-29 NOTE — Patient Instructions (Signed)

## 2015-06-29 NOTE — Progress Notes (Signed)
Subjective:  Kara LynnSamantha E Brooks is a 35 y.o. G3P1102 at 5722w5d being seen today for ongoing prenatal care.  She is currently monitored for the following issues for this high-risk pregnancy and has Supervision of other normal pregnancy, antepartum and AMA (advanced maternal age) multigravida 35+ on her problem list.  Patient reports round ligament pain.  Contractions: Not present. Vag. Bleeding: None.  Movement: Absent. Denies leaking of fluid.   The following portions of the patient's history were reviewed and updated as appropriate: allergies, current medications, past family history, past medical history, past social history, past surgical history and problem list. Problem list updated.  Objective:   Filed Vitals:   06/29/15 1312  BP: 116/77  Pulse: 103  Weight: 173 lb (78.472 kg)    Fetal Status: Fetal Heart Rate (bpm): 156   Movement: Absent     General:  Alert, oriented and cooperative. Patient is in no acute distress.  Skin: Skin is warm and dry. No rash noted.   Cardiovascular: Normal heart rate noted  Respiratory: Normal respiratory effort, no problems with respiration noted  Abdomen: Soft, gravid, appropriate for gestational age. Pain/Pressure: Absent     Pelvic: Vag. Bleeding: None Vag D/C Character: Thin   Cervical exam deferred        Extremities: Normal range of motion.  Edema: None  Mental Status: Normal mood and affect. Normal behavior. Normal judgment and thought content.   Urinalysis: Urine Protein: Negative Urine Glucose: Negative  Assessment and Plan:  Pregnancy: G3P1102 at 5822w5d  1. AMA (advanced maternal age) multigravida 35+, second trimester   2. Supervision of other normal pregnancy, antepartum, second trimester   Preterm labor symptoms and general obstetric precautions including but not limited to vaginal bleeding, contractions, leaking of fluid and fetal movement were reviewed in detail with the patient. Please refer to After Visit Summary for other  counseling recommendations.  Return in about 4 weeks (around 07/27/2015) for schedule anatomy ultrasound.   Rhea PinkLori A Clemmons, CNM

## 2015-06-29 NOTE — Addendum Note (Signed)
Addended by: Arne ClevelandHUTCHINSON, Laval Cafaro J on: 06/29/2015 01:34 PM   Modules accepted: Orders

## 2015-07-13 ENCOUNTER — Encounter: Payer: Medicaid Other | Admitting: Certified Nurse Midwife

## 2015-07-25 ENCOUNTER — Encounter (HOSPITAL_COMMUNITY): Payer: Self-pay | Admitting: Certified Nurse Midwife

## 2015-07-27 ENCOUNTER — Ambulatory Visit (INDEPENDENT_AMBULATORY_CARE_PROVIDER_SITE_OTHER): Payer: Medicaid Other | Admitting: Certified Nurse Midwife

## 2015-07-27 VITALS — BP 128/79 | HR 106 | Wt 176.0 lb

## 2015-07-27 DIAGNOSIS — O09522 Supervision of elderly multigravida, second trimester: Secondary | ICD-10-CM

## 2015-07-27 DIAGNOSIS — Z3482 Encounter for supervision of other normal pregnancy, second trimester: Secondary | ICD-10-CM

## 2015-07-27 DIAGNOSIS — O99019 Anemia complicating pregnancy, unspecified trimester: Secondary | ICD-10-CM

## 2015-07-27 MED ORDER — DOCUSATE SODIUM 100 MG PO CAPS: 100.0000 mg | ORAL_CAPSULE | Freq: Two times a day (BID) | ORAL | Status: DC | PRN

## 2015-07-27 MED ORDER — FERROUS SULFATE 325 (65 FE) MG PO TABS: 325.0000 mg | ORAL_TABLET | Freq: Two times a day (BID) | ORAL | Status: DC

## 2015-07-27 NOTE — Progress Notes (Signed)
Subjective:  Kara Brooks is a 35 y.o. G3P1102 at 4319w5d being seen today for ongoing prenatal care.  She is currently monitored for the following issues for this high-risk pregnancy and has Supervision of other normal pregnancy, antepartum; AMA (advanced maternal age) multigravida 35+; and Anemia affecting pregnancy on her problem list.  Patient reports backache.  Contractions: Not present. Vag. Bleeding: None.  Movement: Present. Denies leaking of fluid.   The following portions of the patient's history were reviewed and updated as appropriate: allergies, current medications, past family history, past medical history, past social history, past surgical history and problem list. Problem list updated.  Objective:   Filed Vitals:   07/27/15 1317  BP: 128/79  Pulse: 106  Weight: 176 lb (79.833 kg)    Fetal Status: Fetal Heart Rate (bpm): 140   Movement: Present     General:  Alert, oriented and cooperative. Patient is in no acute distress.  Skin: Skin is warm and dry. No rash noted.   Cardiovascular: Normal heart rate noted  Respiratory: Normal respiratory effort, no problems with respiration noted  Abdomen: Soft, gravid, appropriate for gestational age. Pain/Pressure: Absent     Pelvic: Vag. Bleeding: None Vag D/C Character: Thin   Cervical exam deferred        Extremities: Normal range of motion.  Edema: Trace  Mental Status: Normal mood and affect. Normal behavior. Normal judgment and thought content.   Urinalysis:      Assessment and Plan:  Pregnancy: G3P1102 at 3919w5d  1. Supervision of other normal pregnancy, antepartum, second trimester  - ferrous sulfate (FERROUSUL) 325 (65 FE) MG tablet; Take 1 tablet (325 mg total) by mouth 2 (two) times daily.  Dispense: 60 tablet; Refill: 1 - docusate sodium (COLACE) 100 MG capsule; Take 1 capsule (100 mg total) by mouth 2 (two) times daily as needed.  Dispense: 30 capsule; Refill: 2  2. AMA (advanced maternal age) multigravida 35+,  second trimester   3. Anemia affecting pregnancy   Preterm labor symptoms and general obstetric precautions including but not limited to vaginal bleeding, contractions, leaking of fluid and fetal movement were reviewed in detail with the patient. Please refer to After Visit Summary for other counseling recommendations.  Return in about 4 weeks (around 08/24/2015).   Kara Brooks, CNM

## 2015-07-27 NOTE — Patient Instructions (Signed)

## 2015-08-05 ENCOUNTER — Telehealth: Payer: Self-pay | Admitting: *Deleted

## 2015-08-05 ENCOUNTER — Other Ambulatory Visit: Payer: Self-pay | Admitting: Certified Nurse Midwife

## 2015-08-05 ENCOUNTER — Ambulatory Visit (HOSPITAL_COMMUNITY)
Admission: RE | Admit: 2015-08-05 | Discharge: 2015-08-05 | Disposition: A | Discharge status: Home / Self Care | Payer: Medicaid Other | Source: Ambulatory Visit | Attending: Certified Nurse Midwife | Admitting: Certified Nurse Midwife

## 2015-08-05 DIAGNOSIS — O09292 Supervision of pregnancy with other poor reproductive or obstetric history, second trimester: Secondary | ICD-10-CM | POA: Diagnosis not present

## 2015-08-05 DIAGNOSIS — Z3A19 19 weeks gestation of pregnancy: Secondary | ICD-10-CM | POA: Diagnosis not present

## 2015-08-05 DIAGNOSIS — Z36 Encounter for antenatal screening of mother: Secondary | ICD-10-CM | POA: Diagnosis not present

## 2015-08-05 DIAGNOSIS — Z3482 Encounter for supervision of other normal pregnancy, second trimester: Secondary | ICD-10-CM

## 2015-08-05 DIAGNOSIS — O09522 Supervision of elderly multigravida, second trimester: Secondary | ICD-10-CM

## 2015-08-05 DIAGNOSIS — L0293 Carbuncle, unspecified: Secondary | ICD-10-CM

## 2015-08-05 NOTE — Telephone Encounter (Signed)
Called pt about boils under her arms, Hirdradenitis. Pt [facility] She has been using Icthammol Ointment and warm compresses with no relief. She would like to know if there is anything else she can do or should she refer to her surgeon about removing the glands again. I will forward to Dr. Shawnie PonsPratt for review.

## 2015-08-05 NOTE — Telephone Encounter (Signed)
-----   Message from Olevia BowensJacinda S Battle sent at 08/05/2015  9:38 AM EDT ----- Regarding: Rx/Advise Has these boils under her arm, states the cream she normally uses isn't working wants to know what else she can do

## 2015-08-08 ENCOUNTER — Telehealth: Payer: Self-pay | Admitting: *Deleted

## 2015-08-08 ENCOUNTER — Telehealth: Payer: Self-pay

## 2015-08-08 NOTE — Telephone Encounter (Signed)
[facility] accept [facility] med[facility]. Pt also was not able to be seen by her previous surgeon. Will call pt to add her to schedule to see if she needs abx or another type of treatment.

## 2015-08-08 NOTE — Telephone Encounter (Signed)
Patient was requesting we put in a referral to a surgeon so she can go see someone about recurrent boils under her arm, referral placed, [facility] [place]. Called patient to tell her and give her central 's contact information if she need to follow up

## 2015-08-08 NOTE — Telephone Encounter (Signed)
Tried calling pt back to go over advice from Dr. Shawnie PonsPratt. Spoke to pt to let her know I will enter referral to Gen Surgery and they will call her to make appt. Faxed referral to [facility] [place]

## 2015-08-09 ENCOUNTER — Ambulatory Visit (INDEPENDENT_AMBULATORY_CARE_PROVIDER_SITE_OTHER): Payer: Medicaid Other | Admitting: Obstetrics & Gynecology

## 2015-08-09 VITALS — BP 114/79 | HR 106 | Wt 175.0 lb

## 2015-08-09 DIAGNOSIS — O99019 Anemia complicating pregnancy, unspecified trimester: Secondary | ICD-10-CM

## 2015-08-09 DIAGNOSIS — L0292 Furuncle, unspecified: Secondary | ICD-10-CM

## 2015-08-09 DIAGNOSIS — Z3482 Encounter for supervision of other normal pregnancy, second trimester: Secondary | ICD-10-CM

## 2015-08-09 DIAGNOSIS — O09522 Supervision of elderly multigravida, second trimester: Secondary | ICD-10-CM

## 2015-08-09 MED ORDER — OXYCODONE-ACETAMINOPHEN 5-325 MG PO TABS: 1.0000 | ORAL_TABLET | ORAL | Status: DC | PRN

## 2015-08-09 NOTE — Progress Notes (Signed)
She is here for a work in visit due to a boil under her arm. She has had these removed in the past by general surgery but they are saying that they do not accept pregnancy medicaid. She is having no OB problems. While here the left axilla boil spontaneously opened and a large amount of pus came out. She felt immediately better. I will give her 10 percocet in the event that another one comes and she needs pain control.

## 2015-08-23 ENCOUNTER — Ambulatory Visit (INDEPENDENT_AMBULATORY_CARE_PROVIDER_SITE_OTHER): Payer: Medicaid Other | Admitting: Obstetrics and Gynecology

## 2015-08-23 ENCOUNTER — Encounter: Payer: Self-pay | Admitting: Obstetrics and Gynecology

## 2015-08-23 VITALS — BP 127/74 | HR 96 | Wt 178.0 lb

## 2015-08-23 DIAGNOSIS — O99019 Anemia complicating pregnancy, unspecified trimester: Secondary | ICD-10-CM

## 2015-08-23 DIAGNOSIS — Z3482 Encounter for supervision of other normal pregnancy, second trimester: Secondary | ICD-10-CM

## 2015-08-23 DIAGNOSIS — O09522 Supervision of elderly multigravida, second trimester: Secondary | ICD-10-CM

## 2015-08-23 NOTE — Progress Notes (Signed)
Subjective:  Kara Brooks is a [age] y.o. G3P1102 at 5531w4d being seen today for ongoing prenatal care.  She is currently monitored for the following issues for this high-risk pregnancy and has Supervision of other normal pregnancy, antepartum; AMA (advanced maternal age) multigravida 35+; and Anemia affecting pregnancy on her problem list.  Patient reports no complaints.  Contractions: Not present. Vag. Bleeding: None.  Movement: Present. Denies leaking of fluid.   The following portions of the patient's history were reviewed and updated as appropriate: allergies, current medications, past family history, past medical history, past social history, past surgical history and problem list. Problem list updated.  Objective:   Filed Vitals:   08/23/15 1320  BP: 127/74  Pulse: 96  Weight: 178 lb (80.74 kg)    Fetal Status: Fetal Heart Rate (bpm): 150   Movement: Present     General:  Alert, oriented and cooperative. Patient is in no acute distress.  Skin: Skin is warm and dry. No rash noted.   Cardiovascular: Normal heart rate noted  Respiratory: Normal respiratory effort, no problems with respiration noted  Abdomen: Soft, gravid, appropriate for gestational age. Pain/Pressure: Absent     Pelvic: Vag. Bleeding: None Vag D/C Character: Thin   Cervical exam deferred        Extremities: Normal range of motion.  Edema: None  Mental Status: Normal mood and affect. Normal behavior. Normal judgment and thought content.   Urinalysis: Urine Protein: Trace (Simultaneous filing. User may not have seen previous data.) Urine Glucose: 2+ (Simultaneous filing. User may not have seen previous data.)  Assessment and Plan:  Pregnancy: G3P1102 at 5731w4d  1. AMA (advanced maternal age) multigravida 35+, second trimester   2. Supervision of other normal pregnancy, antepartum, second trimester Patient is doing well Cont routine prenatal care Will schedule follow up anatomy ultrasound at next  appointment  3. Anemia affecting pregnancy Continue iron supplement  General obstetric precautions including but not limited to vaginal bleeding, contractions, leaking of fluid and fetal movement were reviewed in detail with the patient. Please refer to After Visit Summary for other counseling recommendations.  Return in about 4 weeks (around 09/20/2015).   Catalina AntiguaPeggy Kester Stimpson, MD

## 2015-09-20 ENCOUNTER — Ambulatory Visit (INDEPENDENT_AMBULATORY_CARE_PROVIDER_SITE_OTHER): Payer: Medicaid Other | Admitting: Obstetrics & Gynecology

## 2015-09-20 VITALS — BP 130/70 | HR 94 | Wt 177.4 lb

## 2015-09-20 DIAGNOSIS — O09522 Supervision of elderly multigravida, second trimester: Secondary | ICD-10-CM

## 2015-09-20 DIAGNOSIS — Z3482 Encounter for supervision of other normal pregnancy, second trimester: Secondary | ICD-10-CM

## 2015-09-20 DIAGNOSIS — IMO0002 Reserved for concepts with insufficient information to code with codable children: Secondary | ICD-10-CM

## 2015-09-20 DIAGNOSIS — Z0489 Encounter for examination and observation for other specified reasons: Secondary | ICD-10-CM

## 2015-09-20 NOTE — Progress Notes (Signed)
Subjective:  Kara Brooks is a 35 y.o. G3P1102 at 4231w4d being seen today for ongoing prenatal care.  She is currently monitored for the following issues for this low-risk pregnancy and has Supervision of other normal pregnancy, antepartum; AMA (advanced maternal age) multigravida 35+; and Anemia affecting pregnancy on her problem list.  Patient reports no complaints.  Contractions: Not present. Vag. Bleeding: None.  Movement: Present. Denies leaking of fluid.   The following portions of the patient's history were reviewed and updated as appropriate: allergies, current medications, past family history, past medical history, past social history, past surgical history and problem list. Problem list updated.  Objective:   Filed Vitals:   09/20/15 1312  BP: 130/70  Pulse: 94  Weight: 177 lb 6.4 oz (80.468 kg)    Fetal Status: Fetal Heart Rate (bpm): 147 Fundal Height: 26 cm Movement: Present     General:  Alert, oriented and cooperative. Patient is in no acute distress.  Skin: Skin is warm and dry. No rash noted.   Cardiovascular: Normal heart rate noted  Respiratory: Normal respiratory effort, no problems with respiration noted  Abdomen: Soft, gravid, appropriate for gestational age. Pain/Pressure: Present     Pelvic: Cervical exam deferred        Extremities: Normal range of motion.  Edema: Trace  Mental Status: Normal mood and affect. Normal behavior. Normal judgment and thought content.   Urinalysis: Urine Protein: Trace Urine Glucose: Negative  Assessment and Plan:  Pregnancy: G3P1102 at 6231w4d  1. Evaluate anatomy not seen on prior sonogram Limited anatomy scan at 19 weeks. - US MFM OB FOLLOW UP; Future  2. AMA (advanced maternal age) multigravida 35+, second trimester 3. Supervision of other normal pregnancy, antepartum, second trimester Preterm labor symptoms and general obstetric precautions including but not limited to vaginal bleeding, contractions, leaking of fluid and  fetal movement were reviewed in detail with the patient. Please refer to After Visit Summary for other counseling recommendations.  Return in about 3 weeks (around 10/11/2015) for 1 hr GTT, 3rd trimester labs, TDap, OB Visit.   Tereso NewcomerUgonna A Jebadiah Imperato, MD

## 2015-09-20 NOTE — Patient Instructions (Signed)
Return to clinic for any scheduled appointments or obstetric concerns, or go to [facility] for evaluation  

## 2015-09-22 ENCOUNTER — Ambulatory Visit (HOSPITAL_COMMUNITY)
Admission: RE | Admit: 2015-09-22 | Discharge: 2015-09-22 | Disposition: A | Discharge status: Home / Self Care | Payer: Medicaid Other | Source: Ambulatory Visit | Attending: Obstetrics & Gynecology | Admitting: Obstetrics & Gynecology

## 2015-09-22 ENCOUNTER — Other Ambulatory Visit: Payer: Self-pay | Admitting: Obstetrics & Gynecology

## 2015-09-22 DIAGNOSIS — Z36 Encounter for antenatal screening of mother: Secondary | ICD-10-CM | POA: Insufficient documentation

## 2015-09-22 DIAGNOSIS — IMO0002 Reserved for concepts with insufficient information to code with codable children: Secondary | ICD-10-CM

## 2015-09-22 DIAGNOSIS — O09292 Supervision of pregnancy with other poor reproductive or obstetric history, second trimester: Secondary | ICD-10-CM | POA: Insufficient documentation

## 2015-09-22 DIAGNOSIS — Z3A26 26 weeks gestation of pregnancy: Secondary | ICD-10-CM | POA: Insufficient documentation

## 2015-09-22 DIAGNOSIS — O09522 Supervision of elderly multigravida, second trimester: Secondary | ICD-10-CM

## 2015-09-22 DIAGNOSIS — Z0489 Encounter for examination and observation for other specified reasons: Secondary | ICD-10-CM

## 2015-09-23 ENCOUNTER — Other Ambulatory Visit (HOSPITAL_COMMUNITY): Payer: Self-pay | Admitting: *Deleted

## 2015-09-23 DIAGNOSIS — O365990 Maternal care for other known or suspected poor fetal growth, unspecified trimester, not applicable or unspecified: Secondary | ICD-10-CM

## 2015-10-10 ENCOUNTER — Ambulatory Visit (INDEPENDENT_AMBULATORY_CARE_PROVIDER_SITE_OTHER): Payer: 59 | Admitting: Obstetrics and Gynecology

## 2015-10-10 ENCOUNTER — Encounter: Payer: Self-pay | Admitting: *Deleted

## 2015-10-10 VITALS — BP 119/75 | HR 111 | Wt 181.0 lb

## 2015-10-10 DIAGNOSIS — O09523 Supervision of elderly multigravida, third trimester: Secondary | ICD-10-CM

## 2015-10-10 DIAGNOSIS — Z23 Encounter for immunization: Secondary | ICD-10-CM | POA: Diagnosis not present

## 2015-10-10 DIAGNOSIS — Z3483 Encounter for supervision of other normal pregnancy, third trimester: Secondary | ICD-10-CM

## 2015-10-10 DIAGNOSIS — Z36 Encounter for antenatal screening of mother: Secondary | ICD-10-CM

## 2015-10-10 DIAGNOSIS — O99019 Anemia complicating pregnancy, unspecified trimester: Secondary | ICD-10-CM

## 2015-10-10 MED ORDER — TETANUS-DIPHTH-ACELL PERTUSSIS 5-2.5-18.5 LF-MCG/0.5 IM SUSP
0.5000 mL | Freq: Once | INTRAMUSCULAR | Status: AC
  Administered 2015-10-10: 0.5 mL via INTRAMUSCULAR

## 2015-10-10 NOTE — Progress Notes (Signed)
Subjective:  Kara Brooks is a 35 y.o. G3P1102 at 2468w3d being seen today for ongoing prenatal care.  She is currently monitored for the following issues for this low-risk pregnancy and has Supervision of other normal pregnancy, antepartum; AMA (advanced maternal age) multigravida 35+; and Anemia affecting pregnancy on her problem list.  Patient reports no complaints.  Contractions: Not present.  .  Movement: Present. Denies leaking of fluid.   The following portions of the patient's history were reviewed and updated as appropriate: allergies, current medications, past family history, past medical history, past social history, past surgical history and problem list. Problem list updated.  Objective:   Filed Vitals:   10/10/15 1339  BP: 119/75  Pulse: 111  Weight: 181 lb (82.101 kg)    Fetal Status: Fetal Heart Rate (bpm): 148 Fundal Height: 28 cm Movement: Present     General:  Alert, oriented and cooperative. Patient is in no acute distress.  Skin: Skin is warm and dry. No rash noted.   Cardiovascular: Normal heart rate noted  Respiratory: Normal respiratory effort, no problems with respiration noted  Abdomen: Soft, gravid, appropriate for gestational age. Pain/Pressure: Present     Pelvic: Cervical exam deferred        Extremities: Normal range of motion.  Edema: None  Mental Status: Normal mood and affect. Normal behavior. Normal judgment and thought content.   Urinalysis:      Assessment and Plan:  Pregnancy: G3P1102 at 4368w3d  1. Supervision of other normal pregnancy, antepartum, third trimester Patient is doing well Third trimester labs today BTL consent signed today - Glucose Tolerance, 1 HR (50g) - CBC - HIV antibody - RPR  2. AMA (advanced maternal age) multigravida 35+, third trimester   3. Anemia affecting pregnancy Continue iron  Preterm labor symptoms and general obstetric precautions including but not limited to vaginal bleeding, contractions, leaking of  fluid and fetal movement were reviewed in detail with the patient. Please refer to After Visit Summary for other counseling recommendations.  No Follow-up on file.   Catalina AntiguaPeggy Miesha Bachmann, MD

## 2015-10-10 NOTE — Progress Notes (Signed)
Patient complaining of a lot of pelvic pressure that started last night.

## 2015-10-11 LAB — CBC
HCT: 33.1 % — ABNORMAL LOW (ref 35.0–45.0)
Hemoglobin: 10.8 g/dL — ABNORMAL LOW (ref 11.7–15.5)
MCH: 30 pg (ref 27.0–33.0)
MCHC: 32.6 g/dL (ref 32.0–36.0)
MCV: 91.9 fL (ref 80.0–100.0)
MPV: 11.4 fL (ref 7.5–12.5)
PLATELETS: 258 10*3/uL (ref 140–400)
RBC: 3.6 MIL/uL — ABNORMAL LOW (ref 3.80–5.10)
RDW: 13.4 % (ref 11.0–15.0)
WBC: 10.1 10*3/uL (ref 3.8–10.8)

## 2015-10-11 LAB — RPR

## 2015-10-11 LAB — GLUCOSE TOLERANCE, 1 HOUR (50G) W/O FASTING: Glucose, 1 Hr, gestational: 161 mg/dL — ABNORMAL HIGH (ref <140)

## 2015-10-11 LAB — HIV ANTIBODY (ROUTINE TESTING W REFLEX): HIV 1&2 Ab, 4th Generation: NONREACTIVE

## 2015-10-12 ENCOUNTER — Telehealth: Payer: Self-pay | Admitting: *Deleted

## 2015-10-12 ENCOUNTER — Encounter: Payer: Self-pay | Admitting: *Deleted

## 2015-10-12 NOTE — Telephone Encounter (Signed)
-----   Message from Catalina AntiguaPeggy Constant, MD sent at 10/12/2015  8:24 AM EDT ----- Please inform patient of abnormal glucola and need to have 3 hour test done before her next appointment  Thanks  Peggy

## 2015-10-12 NOTE — Telephone Encounter (Signed)
Called pt, no answer, left message to call the office.  

## 2015-10-13 ENCOUNTER — Other Ambulatory Visit (INDEPENDENT_AMBULATORY_CARE_PROVIDER_SITE_OTHER): Payer: 59 | Admitting: *Deleted

## 2015-10-13 ENCOUNTER — Ambulatory Visit (HOSPITAL_COMMUNITY)
Admission: RE | Admit: 2015-10-13 | Discharge: 2015-10-13 | Disposition: A | Discharge status: Home / Self Care | Payer: Medicaid Other | Source: Ambulatory Visit | Attending: Obstetrics & Gynecology | Admitting: Obstetrics & Gynecology

## 2015-10-13 ENCOUNTER — Encounter (HOSPITAL_COMMUNITY): Payer: Self-pay

## 2015-10-13 VITALS — BP 128/70 | HR 91 | Wt 182.5 lb

## 2015-10-13 DIAGNOSIS — Z3A29 29 weeks gestation of pregnancy: Secondary | ICD-10-CM | POA: Insufficient documentation

## 2015-10-13 DIAGNOSIS — O09522 Supervision of elderly multigravida, second trimester: Secondary | ICD-10-CM | POA: Diagnosis present

## 2015-10-13 DIAGNOSIS — O365930 Maternal care for other known or suspected poor fetal growth, third trimester, not applicable or unspecified: Secondary | ICD-10-CM | POA: Diagnosis not present

## 2015-10-13 DIAGNOSIS — O365990 Maternal care for other known or suspected poor fetal growth, unspecified trimester, not applicable or unspecified: Secondary | ICD-10-CM

## 2015-10-13 DIAGNOSIS — Z36 Encounter for antenatal screening of mother: Secondary | ICD-10-CM | POA: Diagnosis not present

## 2015-10-13 DIAGNOSIS — O09292 Supervision of pregnancy with other poor reproductive or obstetric history, second trimester: Secondary | ICD-10-CM | POA: Insufficient documentation

## 2015-10-13 DIAGNOSIS — O99019 Anemia complicating pregnancy, unspecified trimester: Secondary | ICD-10-CM

## 2015-10-13 DIAGNOSIS — R7302 Impaired glucose tolerance (oral): Secondary | ICD-10-CM | POA: Diagnosis not present

## 2015-10-13 DIAGNOSIS — O09523 Supervision of elderly multigravida, third trimester: Secondary | ICD-10-CM

## 2015-10-13 DIAGNOSIS — O24419 Gestational diabetes mellitus in pregnancy, unspecified control: Secondary | ICD-10-CM

## 2015-10-13 DIAGNOSIS — R7309 Other abnormal glucose: Secondary | ICD-10-CM

## 2015-10-13 DIAGNOSIS — Z3483 Encounter for supervision of other normal pregnancy, third trimester: Secondary | ICD-10-CM

## 2015-10-13 DIAGNOSIS — O0993 Supervision of high risk pregnancy, unspecified, third trimester: Secondary | ICD-10-CM

## 2015-10-14 ENCOUNTER — Telehealth: Payer: Self-pay | Admitting: *Deleted

## 2015-10-14 ENCOUNTER — Other Ambulatory Visit (HOSPITAL_COMMUNITY): Payer: Self-pay | Admitting: *Deleted

## 2015-10-14 DIAGNOSIS — IMO0002 Reserved for concepts with insufficient information to code with codable children: Secondary | ICD-10-CM

## 2015-10-14 DIAGNOSIS — O24410 Gestational diabetes mellitus in pregnancy, diet controlled: Secondary | ICD-10-CM

## 2015-10-14 DIAGNOSIS — O24419 Gestational diabetes mellitus in pregnancy, unspecified control: Secondary | ICD-10-CM | POA: Insufficient documentation

## 2015-10-14 LAB — GLUCOSE TOLERANCE, 3 HOURS
GLUCOSE 3 HOUR GTT: 157 mg/dL — AB (ref <145)
GLUCOSE, 2 HOUR-GESTATIONAL: 169 mg/dL — AB (ref <165)
GLUCOSE, FASTING-GESTATIONAL: 69 mg/dL (ref 65–104)
Glucose Tolerance, 1 hour: 196 mg/dL — ABNORMAL HIGH (ref <190)

## 2015-10-14 NOTE — Telephone Encounter (Signed)
-----   Message from Kara NewcomerUgonna A Anyanwu, MD sent at 10/14/2015 11:37 AM EDT ----- Patient has GDM.  [ ]  GDM education and testing supplies  [ ]  Nutrition  Please call to inform patient of results and recommendations.

## 2015-10-14 NOTE — Telephone Encounter (Signed)
Informed pt of result and that [facility] would be contacting her with an appt.  Pt acknowledged instructions.

## 2015-10-17 ENCOUNTER — Encounter: Payer: Self-pay | Admitting: *Deleted

## 2015-10-20 ENCOUNTER — Other Ambulatory Visit (HOSPITAL_COMMUNITY): Payer: Self-pay | Admitting: Maternal and Fetal Medicine

## 2015-10-20 ENCOUNTER — Ambulatory Visit (HOSPITAL_COMMUNITY)
Admission: RE | Admit: 2015-10-20 | Discharge: 2015-10-20 | Disposition: A | Discharge status: Home / Self Care | Payer: 59 | Source: Ambulatory Visit | Attending: Obstetrics & Gynecology | Admitting: Obstetrics & Gynecology

## 2015-10-20 ENCOUNTER — Encounter (HOSPITAL_COMMUNITY): Payer: Self-pay

## 2015-10-20 VITALS — BP 137/82 | HR 97 | Wt 183.1 lb

## 2015-10-20 DIAGNOSIS — O365930 Maternal care for other known or suspected poor fetal growth, third trimester, not applicable or unspecified: Secondary | ICD-10-CM | POA: Diagnosis not present

## 2015-10-20 DIAGNOSIS — O09523 Supervision of elderly multigravida, third trimester: Secondary | ICD-10-CM | POA: Diagnosis not present

## 2015-10-20 DIAGNOSIS — O09293 Supervision of pregnancy with other poor reproductive or obstetric history, third trimester: Secondary | ICD-10-CM

## 2015-10-20 DIAGNOSIS — O09529 Supervision of elderly multigravida, unspecified trimester: Secondary | ICD-10-CM

## 2015-10-20 DIAGNOSIS — O0993 Supervision of high risk pregnancy, unspecified, third trimester: Secondary | ICD-10-CM

## 2015-10-20 DIAGNOSIS — Z3A30 30 weeks gestation of pregnancy: Secondary | ICD-10-CM | POA: Diagnosis not present

## 2015-10-20 DIAGNOSIS — IMO0002 Reserved for concepts with insufficient information to code with codable children: Secondary | ICD-10-CM

## 2015-10-20 DIAGNOSIS — O99019 Anemia complicating pregnancy, unspecified trimester: Secondary | ICD-10-CM

## 2015-10-24 ENCOUNTER — Ambulatory Visit: Payer: 59 | Admitting: Skilled Nursing Facility1

## 2015-10-28 ENCOUNTER — Ambulatory Visit (HOSPITAL_COMMUNITY)
Admission: RE | Admit: 2015-10-28 | Discharge: 2015-10-28 | Disposition: A | Discharge status: Home / Self Care | Payer: 59 | Source: Ambulatory Visit | Attending: Obstetrics & Gynecology | Admitting: Obstetrics & Gynecology

## 2015-10-28 ENCOUNTER — Encounter (HOSPITAL_COMMUNITY): Payer: Self-pay

## 2015-10-28 VITALS — BP 129/2 | HR 92 | Wt 181.4 lb

## 2015-10-28 DIAGNOSIS — Z3A31 31 weeks gestation of pregnancy: Secondary | ICD-10-CM | POA: Insufficient documentation

## 2015-10-28 DIAGNOSIS — O09529 Supervision of elderly multigravida, unspecified trimester: Secondary | ICD-10-CM

## 2015-10-28 DIAGNOSIS — O09523 Supervision of elderly multigravida, third trimester: Secondary | ICD-10-CM | POA: Diagnosis not present

## 2015-10-28 DIAGNOSIS — IMO0002 Reserved for concepts with insufficient information to code with codable children: Secondary | ICD-10-CM

## 2015-10-28 DIAGNOSIS — O09293 Supervision of pregnancy with other poor reproductive or obstetric history, third trimester: Secondary | ICD-10-CM | POA: Diagnosis not present

## 2015-10-28 DIAGNOSIS — O99019 Anemia complicating pregnancy, unspecified trimester: Secondary | ICD-10-CM

## 2015-10-28 DIAGNOSIS — O365930 Maternal care for other known or suspected poor fetal growth, third trimester, not applicable or unspecified: Secondary | ICD-10-CM | POA: Diagnosis not present

## 2015-10-28 DIAGNOSIS — O0993 Supervision of high risk pregnancy, unspecified, third trimester: Secondary | ICD-10-CM

## 2015-10-31 ENCOUNTER — Ambulatory Visit (INDEPENDENT_AMBULATORY_CARE_PROVIDER_SITE_OTHER): Payer: 59 | Admitting: Obstetrics & Gynecology

## 2015-10-31 VITALS — BP 144/86 | HR 80 | Wt 184.0 lb

## 2015-10-31 DIAGNOSIS — O09529 Supervision of elderly multigravida, unspecified trimester: Secondary | ICD-10-CM

## 2015-10-31 DIAGNOSIS — O0993 Supervision of high risk pregnancy, unspecified, third trimester: Secondary | ICD-10-CM

## 2015-10-31 DIAGNOSIS — O24410 Gestational diabetes mellitus in pregnancy, diet controlled: Secondary | ICD-10-CM

## 2015-10-31 NOTE — Progress Notes (Signed)
Subjective:  Kara Brooks is a 35 y.o. Kara Brooks [id number] (2 daughters at home, this one is a boy) at [redacted]w[redacted]d being seen today for ongoing prenatal care.  She is currently monitored for the following issues for this high-risk pregnancy and has Supervision of high risk pregnancy in third trimester; AMA (advanced maternal age) multigravida 35+; Anemia affecting pregnancy; and Gestational diabetes mellitus (GDM) in third trimester on her problem list. (She has not yet been able to keep appt to see nutritionist. RBS here today is 150. She is getting weekly Dopplers at [facility] as they are concerned about SGA.  Patient reports no complaints.  Contractions: Not present. Vag. Bleeding: None.  Movement: Present. Denies leaking of fluid.   The following portions of the patient's history were reviewed and updated as appropriate: allergies, current medications, past family history, past medical history, past social history, past surgical history and problem list. Problem list updated.  Objective:   Vitals:   10/31/15 1342  BP: (!) 144/86  Pulse: 80  Weight: 184 lb (83.5 kg)    Fetal Status: Fetal Heart Rate (bpm): 150   Movement: Present     General:  Alert, oriented and cooperative. Patient is in no acute distress.  Skin: Skin is warm and dry. No rash noted.   Cardiovascular: Normal heart rate noted  Respiratory: Normal respiratory effort, no problems with respiration noted  Abdomen: Soft, gravid, appropriate for gestational age. Pain/Pressure: Present     Pelvic:  Cervical exam deferred        Extremities: Normal range of motion.  Edema: None  Mental Status: Normal mood and affect. Normal behavior. Normal judgment and thought content.   Urinalysis: Urine Protein: Negative Urine Glucose: Negative  Assessment and Plan:  Pregnancy: G3P1102 at [redacted]w[redacted]d  1. AMA (advanced maternal age) multigravida 35+, unspecified trimester   2. Diet controlled gestational diabetes mellitus (GDM) in third trimester - She  swears that she will go see the nutritionist  3. Supervision of high risk pregnancy in third trimester   Preterm labor symptoms and general obstetric precautions including but not limited to vaginal bleeding, contractions, leaking of fluid and fetal movement were reviewed in detail with the patient. Please refer to After Visit Summary for other counseling recommendations.  No Follow-up on file.   Allie Bossier, MD

## 2015-11-04 ENCOUNTER — Encounter (HOSPITAL_COMMUNITY): Payer: Self-pay

## 2015-11-04 ENCOUNTER — Other Ambulatory Visit (HOSPITAL_COMMUNITY): Payer: Self-pay | Admitting: Maternal and Fetal Medicine

## 2015-11-04 ENCOUNTER — Ambulatory Visit (HOSPITAL_COMMUNITY)
Admission: RE | Admit: 2015-11-04 | Discharge: 2015-11-04 | Disposition: A | Discharge status: Home / Self Care | Payer: 59 | Source: Ambulatory Visit | Attending: Obstetrics & Gynecology | Admitting: Obstetrics & Gynecology

## 2015-11-04 DIAGNOSIS — Z3A32 32 weeks gestation of pregnancy: Secondary | ICD-10-CM

## 2015-11-04 DIAGNOSIS — O09523 Supervision of elderly multigravida, third trimester: Secondary | ICD-10-CM

## 2015-11-04 DIAGNOSIS — O0993 Supervision of high risk pregnancy, unspecified, third trimester: Secondary | ICD-10-CM

## 2015-11-04 DIAGNOSIS — O365930 Maternal care for other known or suspected poor fetal growth, third trimester, not applicable or unspecified: Secondary | ICD-10-CM

## 2015-11-04 DIAGNOSIS — O09293 Supervision of pregnancy with other poor reproductive or obstetric history, third trimester: Secondary | ICD-10-CM

## 2015-11-04 DIAGNOSIS — IMO0002 Reserved for concepts with insufficient information to code with codable children: Secondary | ICD-10-CM

## 2015-11-04 DIAGNOSIS — O09529 Supervision of elderly multigravida, unspecified trimester: Secondary | ICD-10-CM

## 2015-11-04 DIAGNOSIS — O24410 Gestational diabetes mellitus in pregnancy, diet controlled: Secondary | ICD-10-CM

## 2015-11-04 DIAGNOSIS — O99019 Anemia complicating pregnancy, unspecified trimester: Secondary | ICD-10-CM

## 2015-11-07 ENCOUNTER — Encounter: Payer: 59 | Attending: Obstetrics & Gynecology | Admitting: Skilled Nursing Facility1

## 2015-11-07 DIAGNOSIS — O24419 Gestational diabetes mellitus in pregnancy, unspecified control: Secondary | ICD-10-CM | POA: Diagnosis present

## 2015-11-07 DIAGNOSIS — O24410 Gestational diabetes mellitus in pregnancy, diet controlled: Secondary | ICD-10-CM

## 2015-11-07 DIAGNOSIS — Z713 Dietary counseling and surveillance: Secondary | ICD-10-CM | POA: Insufficient documentation

## 2015-11-08 ENCOUNTER — Telehealth: Payer: Self-pay | Admitting: *Deleted

## 2015-11-08 DIAGNOSIS — O24410 Gestational diabetes mellitus in pregnancy, diet controlled: Secondary | ICD-10-CM

## 2015-11-08 MED ORDER — ACCU-CHEK FASTCLIX LANCETS MISC: 1.0000 [IU] | Freq: Four times a day (QID) | 12 refills | Status: DC

## 2015-11-08 MED ORDER — GLUCOSE BLOOD VI STRP: ORAL_STRIP | 12 refills | Status: DC

## 2015-11-08 NOTE — Telephone Encounter (Signed)
-----   Message from Olevia Bowens sent at 11/08/2015 10:25 AM EDT ----- Regarding: Rx Request Contact: [phone] Needs RX for test strip (verio flex strips) And lancets Uses [facility] on [address] in [place]

## 2015-11-08 NOTE — Telephone Encounter (Signed)
Sent strips and lancets to pharmacy per Dr Marice Potter order.

## 2015-11-09 ENCOUNTER — Encounter: Payer: Self-pay | Admitting: Skilled Nursing Facility1

## 2015-11-09 ENCOUNTER — Telehealth: Payer: Self-pay | Admitting: *Deleted

## 2015-11-09 DIAGNOSIS — O24410 Gestational diabetes mellitus in pregnancy, diet controlled: Secondary | ICD-10-CM

## 2015-11-09 MED ORDER — GLUCOSE BLOOD VI STRP: ORAL_STRIP | 12 refills | Status: DC

## 2015-11-09 MED ORDER — ACCU-CHEK AVIVA DEVI: 0 refills | Status: DC

## 2015-11-09 NOTE — Telephone Encounter (Signed)
Pt went to pharmacy to get Blood glucose strips and monitor and insurance does not cover that brand, sent Accu chek to the pharmacy per pt request.

## 2015-11-09 NOTE — Progress Notes (Signed)
  Patient was seen on 07/ 31/2017for Gestational Diabetes self-management class at the [facility]. The following learning objectives were met by the patient during this course:   States the definition of Gestational Diabetes  States why dietary management is important in controlling blood glucose  Describes the effects each nutrient has on blood glucose levels  Demonstrates ability to create a balanced meal plan  Demonstrates carbohydrate counting   States when to check blood glucose levels involving a total of 4 separate occurences in a day  Demonstrates proper blood glucose monitoring techniques  States the effect of stress and exercise on blood/ glucose levels  States the importance of limiting caffeine and abstaining from alcohol and smoking  Demonstrates the knowledge the glucometer provided in class may not be covered by their insurance and to call their insurance provider immediately after class to know which glucometer their insurance provider does cover as well as calling their physician the next day for a prescription to the glucometer their insurance does cover (if the one provided is not) as well as the lancets and strips for that meter.  Blood glucose monitor given: one touch verio flex Lot # [id number] x Exp: 2016-11-06 Blood glucose reading: 126-post prandial  Patient instructed to monitor glucose levels: FBS: 60 - <90 1 hour: <140 2 hour: <120  *Patient received handouts:  Nutrition Diabetes and Pregnancy  Carbohydrate Counting List  Patient will be seen for follow-up as needed.

## 2015-11-10 ENCOUNTER — Inpatient Hospital Stay (HOSPITAL_COMMUNITY)
Admission: AD | Admit: 2015-11-10 | Discharge: 2015-11-20 | DRG: 767 | Disposition: A | Discharge status: Home / Self Care | Payer: 59 | Source: Ambulatory Visit | Attending: Family Medicine | Admitting: Family Medicine

## 2015-11-10 ENCOUNTER — Telehealth: Payer: Self-pay | Admitting: *Deleted

## 2015-11-10 ENCOUNTER — Encounter (HOSPITAL_COMMUNITY): Payer: Self-pay | Admitting: *Deleted

## 2015-11-10 ENCOUNTER — Ambulatory Visit: Payer: 59 | Admitting: *Deleted

## 2015-11-10 VITALS — BP 164/95 | HR 82

## 2015-11-10 DIAGNOSIS — O1413 Severe pre-eclampsia, third trimester: Secondary | ICD-10-CM

## 2015-11-10 DIAGNOSIS — O1414 Severe pre-eclampsia complicating childbirth: Secondary | ICD-10-CM | POA: Diagnosis not present

## 2015-11-10 DIAGNOSIS — I10 Essential (primary) hypertension: Secondary | ICD-10-CM | POA: Diagnosis present

## 2015-11-10 DIAGNOSIS — O24420 Gestational diabetes mellitus in childbirth, diet controlled: Secondary | ICD-10-CM | POA: Diagnosis present

## 2015-11-10 DIAGNOSIS — O24419 Gestational diabetes mellitus in pregnancy, unspecified control: Secondary | ICD-10-CM | POA: Diagnosis present

## 2015-11-10 DIAGNOSIS — O99334 Smoking (tobacco) complicating childbirth: Secondary | ICD-10-CM | POA: Diagnosis present

## 2015-11-10 DIAGNOSIS — Z3A32 32 weeks gestation of pregnancy: Secondary | ICD-10-CM

## 2015-11-10 DIAGNOSIS — O9902 Anemia complicating childbirth: Secondary | ICD-10-CM | POA: Diagnosis present

## 2015-11-10 DIAGNOSIS — Z3A34 34 weeks gestation of pregnancy: Secondary | ICD-10-CM | POA: Diagnosis not present

## 2015-11-10 DIAGNOSIS — O114 Pre-existing hypertension with pre-eclampsia, complicating childbirth: Principal | ICD-10-CM | POA: Diagnosis present

## 2015-11-10 DIAGNOSIS — IMO0001 Reserved for inherently not codable concepts without codable children: Secondary | ICD-10-CM

## 2015-11-10 DIAGNOSIS — Z8249 Family history of ischemic heart disease and other diseases of the circulatory system: Secondary | ICD-10-CM

## 2015-11-10 DIAGNOSIS — O365990 Maternal care for other known or suspected poor fetal growth, unspecified trimester, not applicable or unspecified: Secondary | ICD-10-CM | POA: Diagnosis present

## 2015-11-10 DIAGNOSIS — Z3A33 33 weeks gestation of pregnancy: Secondary | ICD-10-CM | POA: Diagnosis not present

## 2015-11-10 DIAGNOSIS — Z9851 Tubal ligation status: Secondary | ICD-10-CM

## 2015-11-10 DIAGNOSIS — F17210 Nicotine dependence, cigarettes, uncomplicated: Secondary | ICD-10-CM | POA: Diagnosis present

## 2015-11-10 DIAGNOSIS — O09523 Supervision of elderly multigravida, third trimester: Secondary | ICD-10-CM

## 2015-11-10 DIAGNOSIS — O99019 Anemia complicating pregnancy, unspecified trimester: Secondary | ICD-10-CM | POA: Diagnosis present

## 2015-11-10 DIAGNOSIS — O24414 Gestational diabetes mellitus in pregnancy, insulin controlled: Secondary | ICD-10-CM

## 2015-11-10 DIAGNOSIS — O0993 Supervision of high risk pregnancy, unspecified, third trimester: Secondary | ICD-10-CM

## 2015-11-10 DIAGNOSIS — O365930 Maternal care for other known or suspected poor fetal growth, third trimester, not applicable or unspecified: Secondary | ICD-10-CM | POA: Diagnosis present

## 2015-11-10 DIAGNOSIS — O1410 Severe pre-eclampsia, unspecified trimester: Secondary | ICD-10-CM | POA: Diagnosis present

## 2015-11-10 DIAGNOSIS — Z833 Family history of diabetes mellitus: Secondary | ICD-10-CM

## 2015-11-10 DIAGNOSIS — R030 Elevated blood-pressure reading, without diagnosis of hypertension: Principal | ICD-10-CM

## 2015-11-10 DIAGNOSIS — Z302 Encounter for sterilization: Secondary | ICD-10-CM

## 2015-11-10 DIAGNOSIS — O09529 Supervision of elderly multigravida, unspecified trimester: Secondary | ICD-10-CM

## 2015-11-10 LAB — TYPE AND SCREEN
ABO/RH(D): O POS
Antibody Screen: NEGATIVE

## 2015-11-10 LAB — COMPREHENSIVE METABOLIC PANEL
ALK PHOS: 82 U/L (ref 38–126)
ALT: 8 U/L — AB (ref 14–54)
AST: 17 U/L (ref 15–41)
Albumin: 3.4 g/dL — ABNORMAL LOW (ref 3.5–5.0)
Anion gap: 7 (ref 5–15)
BILIRUBIN TOTAL: 0.4 mg/dL (ref 0.3–1.2)
BUN: 10 mg/dL (ref 6–20)
CO2: 24 mmol/L (ref 22–32)
Calcium: 9.6 mg/dL (ref 8.9–10.3)
Chloride: 106 mmol/L (ref 101–111)
Creatinine, Ser: 0.58 mg/dL (ref 0.44–1.00)
Glucose, Bld: 84 mg/dL (ref 65–99)
Potassium: 4.3 mmol/L (ref 3.5–5.1)
Sodium: 137 mmol/L (ref 135–145)
Total Protein: 6.2 g/dL — ABNORMAL LOW (ref 6.5–8.1)

## 2015-11-10 LAB — CBC
HCT: 33.8 % — ABNORMAL LOW (ref 36.0–46.0)
HEMOGLOBIN: 11.6 g/dL — AB (ref 12.0–15.0)
MCH: 30.7 pg (ref 26.0–34.0)
MCHC: 34.3 g/dL (ref 30.0–36.0)
MCV: 89.4 fL (ref 78.0–100.0)
Platelets: 217 10*3/uL (ref 150–400)
RBC: 3.78 MIL/uL — AB (ref 3.87–5.11)
RDW: 13.5 % (ref 11.5–15.5)
WBC: 11 10*3/uL — ABNORMAL HIGH (ref 4.0–10.5)

## 2015-11-10 LAB — GLUCOSE, CAPILLARY
GLUCOSE-CAPILLARY: 253 mg/dL — AB (ref 65–99)
Glucose-Capillary: 168 mg/dL — ABNORMAL HIGH (ref 65–99)
Glucose-Capillary: 303 mg/dL — ABNORMAL HIGH (ref 65–99)

## 2015-11-10 LAB — ABO/RH: ABO/RH(D): O POS

## 2015-11-10 MED ORDER — DOCUSATE SODIUM 100 MG PO CAPS
100.0000 mg | ORAL_CAPSULE | Freq: Every day | ORAL | Status: DC
  Administered 2015-11-11 – 2015-11-17 (×7): 100 mg via ORAL
  Filled 2015-11-10 (×7): qty 1

## 2015-11-10 MED ORDER — CALCIUM CARBONATE ANTACID 500 MG PO CHEW
2.0000 | CHEWABLE_TABLET | ORAL | Status: DC | PRN
  Administered 2015-11-11: 400 mg via ORAL
  Filled 2015-11-10: qty 2

## 2015-11-10 MED ORDER — HYDRALAZINE HCL 20 MG/ML IJ SOLN
10.0000 mg | Freq: Once | INTRAMUSCULAR | Status: AC | PRN
  Administered 2015-11-12: 10 mg via INTRAVENOUS
  Filled 2015-11-10: qty 1

## 2015-11-10 MED ORDER — ZOLPIDEM TARTRATE 5 MG PO TABS
5.0000 mg | ORAL_TABLET | Freq: Every evening | ORAL | Status: DC | PRN
  Administered 2015-11-11 – 2015-11-18 (×4): 5 mg via ORAL
  Filled 2015-11-10 (×4): qty 1

## 2015-11-10 MED ORDER — LACTATED RINGERS IV SOLN
INTRAVENOUS | Status: DC
  Administered 2015-11-10 – 2015-11-11 (×2): via INTRAVENOUS

## 2015-11-10 MED ORDER — LABETALOL HCL 5 MG/ML IV SOLN
20.0000 mg | INTRAVENOUS | Status: AC | PRN
  Administered 2015-11-12: 40 mg via INTRAVENOUS
  Administered 2015-11-12: 20 mg via INTRAVENOUS
  Administered 2015-11-12: 80 mg via INTRAVENOUS
  Filled 2015-11-10: qty 4
  Filled 2015-11-10: qty 8
  Filled 2015-11-10: qty 16

## 2015-11-10 MED ORDER — LABETALOL HCL 100 MG PO TABS
200.0000 mg | ORAL_TABLET | Freq: Three times a day (TID) | ORAL | Status: DC
  Administered 2015-11-10 – 2015-11-12 (×8): 200 mg via ORAL
  Filled 2015-11-10 (×8): qty 2

## 2015-11-10 MED ORDER — INSULIN ASPART 100 UNIT/ML ~~LOC~~ SOLN
0.0000 [IU] | Freq: Three times a day (TID) | SUBCUTANEOUS | Status: DC
  Administered 2015-11-10: 15 [IU] via SUBCUTANEOUS
  Administered 2015-11-11 – 2015-11-12 (×2): 3 [IU] via SUBCUTANEOUS
  Administered 2015-11-13 – 2015-11-17 (×2): 4 [IU] via SUBCUTANEOUS
  Administered 2015-11-18: 3 [IU] via SUBCUTANEOUS

## 2015-11-10 MED ORDER — INSULIN ASPART 100 UNIT/ML ~~LOC~~ SOLN
4.0000 [IU] | Freq: Three times a day (TID) | SUBCUTANEOUS | Status: DC
  Administered 2015-11-11 – 2015-11-18 (×10): 4 [IU] via SUBCUTANEOUS

## 2015-11-10 MED ORDER — PRENATAL MULTIVITAMIN CH
1.0000 | ORAL_TABLET | Freq: Every day | ORAL | Status: DC
  Administered 2015-11-11 – 2015-11-17 (×7): 1 via ORAL
  Filled 2015-11-10 (×7): qty 1

## 2015-11-10 MED ORDER — MAGNESIUM SULFATE BOLUS VIA INFUSION
4.0000 g | Freq: Once | INTRAVENOUS | Status: AC
  Administered 2015-11-10: 4 g via INTRAVENOUS
  Filled 2015-11-10: qty 500

## 2015-11-10 MED ORDER — ACETAMINOPHEN 325 MG PO TABS
650.0000 mg | ORAL_TABLET | ORAL | Status: DC | PRN
  Administered 2015-11-15 (×2): 650 mg via ORAL
  Filled 2015-11-10 (×3): qty 2

## 2015-11-10 MED ORDER — BETAMETHASONE SOD PHOS & ACET 6 (3-3) MG/ML IJ SUSP
12.0000 mg | INTRAMUSCULAR | Status: AC
  Administered 2015-11-10 – 2015-11-11 (×2): 12 mg via INTRAMUSCULAR
  Filled 2015-11-10 (×2): qty 2

## 2015-11-10 MED ORDER — LACTATED RINGERS IV SOLN
2.0000 g/h | INTRAVENOUS | Status: DC
  Administered 2015-11-11: 2 g/h via INTRAVENOUS
  Filled 2015-11-10 (×2): qty 80

## 2015-11-10 NOTE — Progress Notes (Signed)
PT upset talking on the phone to her utilities provider as they are disconnecting her power. Encouraged patient to calm down to repeat BP

## 2015-11-10 NOTE — Progress Notes (Signed)
Pt came in office c/o headache and increased swelling in her extremities since yesterday.  Had been monitoring her BPs from home which had been around 160/100s.  Came in office for BP check.  BP 164/95.  Informed Dr Adrian Blackwater who is the MD on call and stated pt would need to come to [facility] for direct admit for overnight evaluation.  Informed pt of instructions and sent to [facility].

## 2015-11-10 NOTE — Telephone Encounter (Signed)
Called pt to inform her of direct admit and instructions when arriving at [facility].

## 2015-11-10 NOTE — H&P (Signed)
ANTEPARTUM ADMISSION HISTORY AND PHYSICAL NOTE   History of Present Illness: Kara Brooks is a 35 y.o. [id number] at [redacted]w[redacted]d admitted for preeclampsia with severe features.  Patient reports the fetal movement as active. Patient reports uterine contraction  activity as none. Patient reports  vaginal bleeding as none. Patient describes fluid per vagina as None. Fetal presentation is cephalic.   Patient mentioned she used to have HTN, on medications due to "pretty high blood pressures" for 1 year after her last child was born.  Patient Active Problem List   Diagnosis Date Noted  . Preeclampsia, severe 11/10/2015    Priority: High  . Gestational diabetes mellitus (GDM) in third trimester 10/14/2015  . Anemia affecting pregnancy 07/27/2015  . Supervision of high risk pregnancy in third trimester 05/17/2015  . AMA (advanced maternal age) multigravida 35+ 05/17/2015    Past Medical History:  Diagnosis Date  . Endometriosis   . History of hidradenitis suppurativa 2016  . Hypertension     Past Surgical History:  Procedure Laterality Date  . Laprascopy  2007  . Sweat gland removal  2016    OB History  Gravida Para Term Preterm AB Living  3 2 1 1   2   SAB TAB Ectopic Multiple Live Births          2    # Outcome Date GA Lbr Len/2nd Weight Sex Delivery Anes PTL Lv  3 Current           2 Preterm 10/06/08 [redacted]w[redacted]d  4 lb 6 oz (1.984 kg) F Vag-Spont  N LIV     Complications: IUGR (intrauterine growth restriction)  1 Term 11/30/06 [redacted]w[redacted]d  7 lb 8 oz (3.402 kg) F Vag-Spont   LIV     Complications: Chorioamnionitis      Social History   Social History  . Marital status: Single    Spouse name: N/A  . Number of children: N/A  . Years of education: N/A   Social History Main Topics  . Smoking status: Current Some Day Smoker    Years: 15.00    Types: Cigarettes  . Smokeless tobacco: Never Used  . Alcohol use No  . Drug use: No  . Sexual activity: Yes    Birth control/  protection: None   Other Topics Concern  . None   Social History Narrative  . None    Family History  Problem Relation Age of Onset  . Hypertension Mother   . Diabetes Mother   . Depression Mother     Allergies  Allergen Reactions  . Flagyl [Metronidazole] Hives  . Sulfa Antibiotics Itching    Prescriptions Prior to Admission  Medication Sig Dispense Refill Last Dose  . docusate sodium (COLACE) 100 MG capsule Take 1 capsule (100 mg total) by mouth 2 (two) times daily as needed. (Patient taking differently: Take 100 mg by mouth daily as needed for mild constipation. ) 30 capsule 2 11/09/2015 at Unknown time  . ferrous sulfate (FERROUSUL) 325 (65 FE) MG tablet Take 1 tablet (325 mg total) by mouth 2 (two) times daily. 60 tablet 1 11/09/2015 at Unknown time  . Prenatal Vit-Fe Fumarate-FA (PRENATAL VITAMIN PO) Take 1 tablet by mouth daily.    11/09/2015 at Unknown time  . ACCU-CHEK FASTCLIX LANCETS MISC 1 Units by Percutaneous route 4 (four) times daily. 100 each 12   . Blood Glucose Monitoring Suppl (ACCU-CHEK AVIVA) device Use as instructed 1 each 0   . glucose blood (ACCU-CHEK AVIVA) test strip  Use as instructed 100 each 12   . glucose blood test strip Check blood sugar 4 times daily as instructed 100 each 12     Review of Systems - General ROS: negative Psychological ROS: negative Ophthalmic ROS: positive for - blurry vision ENT ROS: positive for - nasal congestion Allergy and Immunology ROS: positive for - nasal congestion, postnasal drip and seasonal allergies Endocrine ROS: negative for - polydipsia/polyuria Respiratory ROS: no cough, shortness of breath, or wheezing Cardiovascular ROS: no chest pain or dyspnea on exertion Gastrointestinal ROS: no abdominal pain, change in bowel habits, or black or bloody stools Genito-Urinary ROS: no dysuria, trouble voiding, or hematuria Musculoskeletal ROS: positive for - swelling in foot - bilateral Neurological ROS: positive for -  headaches Dermatological ROS: negative  Vitals:  BP (!) 157/100   Pulse 79   Resp 17   Ht  (1.676 m)   Wt 184 lb (83.5 kg)   LMP 03/03/2015 (Approximate)   BMI 29.70 kg/m  Physical Examination: CONSTITUTIONAL: Well-developed, well-nourished female in no acute distress. Obese. HENT:  Normocephalic, atraumatic, External right and left ear normal. Oropharynx is clear and moist EYES: Conjunctivae and EOM are normal. Pupils are equal, round, and reactive to light. No scleral icterus.  NECK: Normal range of motion, supple, no masses SKIN: Skin is warm and dry. No rash noted. Not diaphoretic. No erythema. No pallor. NEUROLGIC: Alert and oriented to person, place, and time. Normal reflexes, muscle tone coordination. No cranial nerve deficit noted. PSYCHIATRIC: Normal mood and affect. Normal behavior. Normal judgment and thought content. CARDIOVASCULAR: Normal heart rate noted, regular rhythm RESPIRATORY: Effort and breath sounds normal, no problems with respiration noted ABDOMEN: Soft, nontender, nondistended, gravid. MUSCULOSKELETAL: Normal range of motion. Trace edema and no tenderness. 2+ distal pulses.  Cervix: Not evaluated Membranes:intact Fetal Monitoring:Baseline: 120 bpm, Variability: moderate, Accelerations: Reactive and Decelerations: Absent Tocometer: Flat  Labs:  No results found for this or any previous visit (from the past 24 hour(s)).  Imaging Studies: [place] Mfm Fetal Bpp Wo Non Stress  Result Date: 11/07/2015 OBSTETRICAL ULTRASOUND: This exam was performed within a [place] [facility]. The OB US report was generated in the AS system, and faxed to the ordering physician.  This report is available in the [employer]. See the AS Obstetric US report via the Image Link.  [place] Mfm Fetal Bpp Wo Non Stress  Result Date: 10/28/2015 OBSTETRICAL ULTRASOUND: This exam was performed within a [place] [facility] Department. The OB US report was generated in the AS  system, and faxed to the ordering physician.  This report is available in the [employer]. See the AS Obstetric US report via the Image Link.  [place] Mfm Fetal Bpp Wo Non Stress  Result Date: 10/20/2015 OBSTETRICAL ULTRASOUND: This exam was performed within a [place] Ultrasound Department. The OB US report was generated in the AS system, and faxed to the ordering physician.  This report is available in the [employer]. See the AS Obstetric US report via the Image Link.  [place] Mfm Ob Follow Up  Result Date: 11/07/2015 OBSTETRICAL ULTRASOUND: This exam was performed within a [place] Ultrasound Department. The OB US report was generated in the AS system, and faxed to the ordering physician.  This report is available in the [employer]. See the AS Obstetric US report via the Image Link.  [place] Mfm Ob Follow Up  Result Date: 10/13/2015 OBSTETRICAL ULTRASOUND: This exam was performed within a [place] Ultrasound Department. The  OB US report was generated in the AS system, and faxed to the ordering physician.  This report is available in the [employer]. See the AS Obstetric US report via the Image Link.  [place] Mfm Ua Cord Doppler  Result Date: 11/07/2015 OBSTETRICAL ULTRASOUND: This exam was performed within a [place] Ultrasound Department. The OB US report was generated in the AS system, and faxed to the ordering physician.  This report is available in the [employer]. See the AS Obstetric US report via the Image Link.  [place] [facility] Ua Cord Doppler  Result Date: 10/28/2015 OBSTETRICAL ULTRASOUND: This exam was performed within a [place] Ultrasound Department. The OB US report was generated in the AS system, and faxed to the ordering physician.  This report is available in the [employer]. See the AS Obstetric US report via the Image Link.  [place] [facility]fm Ua Cord Doppler  Result Date: 10/20/2015 OBSTETRICAL ULTRASOUND: This exam was performed within a [place] Ultrasound Department. The OB US report was  generated in the AS system, and faxed to the ordering physician.  This report is available in the [employer]. See the AS Obstetric US report via the Image Link.  [place] Mfm Ua Cord Doppler  Result Date: 10/13/2015 OBSTETRICAL ULTRASOUND: This exam was performed within a [place] Ultrasound Department. The OB US report was generated in the AS system, and faxed to the ordering physician.  This report is available in the [employer]. See the AS Obstetric US report via the Image Link.    Assessment and Plan: Patient Active Problem List   Diagnosis Date Noted  . Preeclampsia, severe 11/10/2015    Priority: High  . Gestational diabetes mellitus (GDM) in third trimester 10/14/2015  . Anemia affecting pregnancy 07/27/2015  . Supervision of high risk pregnancy in third trimester 05/17/2015  . AMA (advanced maternal age) multigravida 35+ 05/17/2015    Chronic HTN with superimposed preeclampsia with severe features.  Admit to Antenatal Routine antenatal care Magnesium drip Betamethasone x2 HTN protocol CBC, CMP, 24 hr urine protein    Jen Mow, DO OB Fellow

## 2015-11-11 ENCOUNTER — Inpatient Hospital Stay (HOSPITAL_COMMUNITY): Admission: RE | Admit: 2015-11-11 | Payer: 59 | Source: Ambulatory Visit

## 2015-11-11 ENCOUNTER — Inpatient Hospital Stay (HOSPITAL_COMMUNITY): Payer: 59

## 2015-11-11 DIAGNOSIS — O365990 Maternal care for other known or suspected poor fetal growth, unspecified trimester, not applicable or unspecified: Secondary | ICD-10-CM | POA: Diagnosis present

## 2015-11-11 DIAGNOSIS — Z3A33 33 weeks gestation of pregnancy: Secondary | ICD-10-CM

## 2015-11-11 DIAGNOSIS — O1413 Severe pre-eclampsia, third trimester: Secondary | ICD-10-CM

## 2015-11-11 LAB — GLUCOSE, CAPILLARY
GLUCOSE-CAPILLARY: 123 mg/dL — AB (ref 65–99)
GLUCOSE-CAPILLARY: 167 mg/dL — AB (ref 65–99)
GLUCOSE-CAPILLARY: 168 mg/dL — AB (ref 65–99)
GLUCOSE-CAPILLARY: 188 mg/dL — AB (ref 65–99)
GLUCOSE-CAPILLARY: 214 mg/dL — AB (ref 65–99)
Glucose-Capillary: 159 mg/dL — ABNORMAL HIGH (ref 65–99)
Glucose-Capillary: 137 mg/dL — ABNORMAL HIGH (ref 65–99)
Glucose-Capillary: 184 mg/dL — ABNORMAL HIGH (ref 65–99)

## 2015-11-11 LAB — CULTURE, BETA STREP (GROUP B ONLY)

## 2015-11-11 LAB — PROTEIN, URINE, 24 HOUR
COLLECTION INTERVAL-UPROT: 24 h
PROTEIN, 24H URINE: 230 mg/d — AB (ref 50–100)
Protein, Urine: 9 mg/dL
Urine Total Volume-UPROT: 2550 mL

## 2015-11-11 LAB — OB RESULTS CONSOLE GBS
STREP GROUP B AG: POSITIVE
STREP GROUP B AG: POSITIVE

## 2015-11-11 MED ORDER — SODIUM CHLORIDE 0.9% FLUSH
3.0000 mL | Freq: Two times a day (BID) | INTRAVENOUS | Status: DC
  Administered 2015-11-11 – 2015-11-18 (×11): 3 mL via INTRAVENOUS

## 2015-11-11 MED ORDER — NICOTINE 14 MG/24HR TD PT24
14.0000 mg | MEDICATED_PATCH | Freq: Every day | TRANSDERMAL | Status: DC
  Filled 2015-11-11 (×11): qty 1

## 2015-11-11 MED ORDER — PANTOPRAZOLE SODIUM 40 MG PO TBEC
40.0000 mg | DELAYED_RELEASE_TABLET | Freq: Every day | ORAL | Status: DC
  Administered 2015-11-11 – 2015-11-17 (×7): 40 mg via ORAL
  Filled 2015-11-11 (×7): qty 1

## 2015-11-11 NOTE — Progress Notes (Addendum)
Inpatient Diabetes Program Recommendations  AACE/ADA: New Consensus Statement on Inpatient Glycemic Control (2015)  Target Ranges:  Prepandial:   less than 140 mg/dL      Peak postprandial:   less than 180 mg/dL (1-2 hours)      Critically ill patients:  140 - 180 mg/dL   Results for MUREL, STALLONE (MRN [id number]) as of 11/11/2015 08:16  Ref. Range 11/10/2015 18:14 11/10/2015 21:01 11/10/2015 21:47 11/11/2015 00:11 11/11/2015 06:09  Glucose-Capillary Latest Ref Range: 65 - 99 mg/dL 976 (H) 734 (H) 193 (H) 214 (H) 159 (H)   Review of Glycemic Control  Diabetes history: GDM Outpatient Diabetes medications: None; diet controlled Current orders for Inpatient glycemic control: Novolog 0-20 units TID with meals, Novolog 4 units TID with meals for meal coverage  Inpatient Diabetes Program Recommendations: Correction (SSI): Recommend discontinuing currently ordered Novolog correction and use the Diabetic Pregnant Patient order set to order CBGs and Novolog correction 0-24 units QID (fasting and 2 hour post prandial).  NOTE: In reviewing the chart, noted patient received Betamethasone 12mg  on 11/10/15 and will receive another dose today. Steroids likely cause of hyperglycemia. Recommend using the Diabetic Pregnant Patient order set to order Novolog 0-24 units  QID (fasting and 2 hour post prandial) and continue Novolog meal coverage as ordered.   Thanks, Orlando Penner, RN, MSN, CDE Diabetes Coordinator Inpatient Diabetes Program [phone] (Team Pager from 8am to 5pm) [phone] ([facility] office) [phone] [facility] office) [phone] [facility] office)

## 2015-11-11 NOTE — Progress Notes (Signed)
Initial Nutrition Assessment  DOCUMENTATION CODES:  Not applicable  INTERVENTION:  Carbohydrate modified gestational diabetic diet  NUTRITION DIAGNOSIS:  Increased nutrient needs related to  (pregnancy and fetal growth requirements) as evidenced by  (33 weeks IUP).  GOAL:  Patient will meet greater than or equal to 90% of their needs  MONITOR:  Labs, Weight trends   REASON FOR ASSESSMENT:  Antenatal   ASSESSMENT:   33 weeks, adm with preeclampsia. GDM diet controlled prior to BMZ. pre-preg weight 178 lbs. BMI 28.8. 6 lb weight gain Diet Order:  Diet gestational carb mod Room service appropriate? Yes; Fluid consistency: Thin     Height:   Ht Readings from Last 1 Encounters:  11/10/15 5\' 6"  (1.676 m)   Weight:   Wt Readings from Last 1 Encounters:  11/10/15 184 lb (83.5 kg)   Ideal Body Weight:   130 lbs BMI:  Body mass index is 29.7 kg/m.  Estimated Nutritional Needs:  Kcal:  2000-2200 Protein:  90-100 g Fluid:  2.3 L  EDUCATION NEEDS:   No education needs identified at this time (Education on GDM diet 11/07/15)  Elisabeth Cara M.Odis Luster LDN Neonatal Nutrition Support Specialist/RD III Pager [phone]      Phone [phone]

## 2015-11-11 NOTE — Progress Notes (Signed)
Patient ID: Kara Brooks, female   DOB: 02/03/1981, 35 y.o.   MRN: [id number] FACULTY PRACTICE ANTEPARTUM NOTE  UMU SCHILLO is a 35 y.o. [id number] at [redacted]w[redacted]d  who is admitted for preeclampsia with severe features.   Fetal presentation is cephalic. Length of Stay:  1  Days  Subjective: Mild headache.  No RUQ pain, nausea. Blood sugars have been high - had donut yesterday. Patient reports good fetal movement.   She reports no uterine contractions She reports no bleeding  She reports no loss of fluid per vagina.  Vitals:  Blood pressure (!) 144/87, pulse 89, temperature 97.6 F (36.4 C), temperature source Oral, resp. rate 18, height 5\' 6"  (1.676 m), weight 184 lb (83.5 kg), last menstrual period 03/03/2015, SpO2 100 %. Physical Examination:  General appearance - alert, well appearing, and in no distress Chest - clear to auscultation, no wheezes, rales or rhonchi, symmetric air entry Heart - normal rate, regular rhythm, normal S1, S2, no murmurs, rubs, clicks or gallops Abdomen - soft, nontender, nondistended, no masses or organomegaly Fundal Height:  size equals dates Extremities: extremities normal, atraumatic, no cyanosis or edema with DTRs 3+ bilaterally Membranes:intact  Fetal Monitoring: NST x2 reviewed.  Both tracings: Baseline: 135 bpm, Variability: Good {> 6 bpm), Accelerations: Reactive and Decelerations: Absent  Labs:  Results for orders placed or performed during the hospital encounter of 11/10/15 (from the past 24 hour(s))  CBC on admission   Collection Time: 11/10/15  1:55 PM  Result Value Ref Range   WBC 11.0 (H) 4.0 - 10.5 K/uL   RBC 3.78 (L) 3.87 - 5.11 MIL/uL   Hemoglobin 11.6 (L) 12.0 - 15.0 g/dL   HCT 13.0 (L) 86.5 - 78.4 %   MCV 89.4 78.0 - 100.0 fL   MCH 30.7 26.0 - 34.0 pg   MCHC 34.3 30.0 - 36.0 g/dL   RDW 69.6 29.5 - 28.4 %   Platelets 217 150 - 400 K/uL  Comprehensive metabolic panel   Collection Time: 11/10/15  1:55 PM  Result Value Ref Range   Sodium 137 135 - 145 mmol/L   Potassium 4.3 3.5 - 5.1 mmol/L   Chloride 106 101 - 111 mmol/L   CO2 24 22 - 32 mmol/L   Glucose, Bld 84 65 - 99 mg/dL   BUN 10 6 - 20 mg/dL   Creatinine, Ser 1.32 0.44 - 1.00 mg/dL   Calcium 9.6 8.9 - 44.0 mg/dL   Total Protein 6.2 (L) 6.5 - 8.1 g/dL   Albumin 3.4 (L) 3.5 - 5.0 g/dL   AST 17 15 - 41 U/L   ALT 8 (L) 14 - 54 U/L   Alkaline Phosphatase 82 38 - 126 U/L   Total Bilirubin 0.4 0.3 - 1.2 mg/dL   GFR calc non Af Amer >60 >60 mL/min   GFR calc Af Amer >60 >60 mL/min   Anion gap 7 5 - 15  Type and screen [facility]   Collection Time: 11/10/15  1:55 PM  Result Value Ref Range   ABO/RH(D) O POS    Antibody Screen NEG    Sample Expiration 11/13/2015   ABO/Rh   Collection Time: 11/10/15  1:55 PM  Result Value Ref Range   ABO/RH(D) O POS   Glucose, capillary   Collection Time: 11/10/15  6:14 PM  Result Value Ref Range   Glucose-Capillary 168 (H) 65 - 99 mg/dL   Comment 1 Notify RN    Comment 2 Document in Chart  Glucose, capillary   Collection Time: 11/10/15  9:01 PM  Result Value Ref Range   Glucose-Capillary 253 (H) 65 - 99 mg/dL  Glucose, capillary   Collection Time: 11/10/15  9:47 PM  Result Value Ref Range   Glucose-Capillary 303 (H) 65 - 99 mg/dL  Glucose, capillary   Collection Time: 11/11/15 12:11 AM  Result Value Ref Range   Glucose-Capillary 214 (H) 65 - 99 mg/dL  Glucose, capillary   Collection Time: 11/11/15  6:09 AM  Result Value Ref Range   Glucose-Capillary 159 (H) 65 - 99 mg/dL    Imaging Studies:    UA dopplers and BPP today.   Medications:  Scheduled . betamethasone acetate-betamethasone sodium phosphate  12 mg Intramuscular Q24H  . docusate sodium  100 mg Oral Daily  . insulin aspart  0-20 Units Subcutaneous TID WC  . insulin aspart  4 Units Subcutaneous TID WC  . labetalol  200 mg Oral Q8H  . prenatal multivitamin  1 tablet Oral Q1200   I have reviewed the patient's current  medications.  ASSESSMENT: Patient Active Problem List   Diagnosis Date Noted  . IUGR (intrauterine growth restriction) affecting care of mother 11/11/2015  . Preeclampsia, severe 11/10/2015  . Gestational diabetes mellitus (GDM) in third trimester 10/14/2015  . Anemia affecting pregnancy 07/27/2015  . Supervision of high risk pregnancy in third trimester 05/17/2015  . AMA (advanced maternal age) multigravida 35+ 05/17/2015    PLAN: 1.  Preeclampsia with Severe features  BPs better controlled  Continue Labetalol  q8  Continue magnesium  24hr urine pending.  Continue steroids - 2nd dose of BMZ this afternoon.  Delivery at 34 weeks.  Continue NST twice daily  NSTs reactive x2 2.  GDM  Previously diet controlled - elevated from BMZ  CBGs AC and 2hrs postprandial  SSI with 4 units of insulin premeal 3.  IUGR  Dopplers and BPP today  AC < 3% 4.  [redacted] week Gestation  GBS culture pending.  Continue routine antenatal care.   Rhona Raider Stinson, DO 11/11/2015,7:19 AM

## 2015-11-12 DIAGNOSIS — Z3A33 33 weeks gestation of pregnancy: Secondary | ICD-10-CM

## 2015-11-12 DIAGNOSIS — O1413 Severe pre-eclampsia, third trimester: Secondary | ICD-10-CM

## 2015-11-12 LAB — GLUCOSE, CAPILLARY
GLUCOSE-CAPILLARY: 116 mg/dL — AB (ref 65–99)
GLUCOSE-CAPILLARY: 143 mg/dL — AB (ref 65–99)
GLUCOSE-CAPILLARY: 148 mg/dL — AB (ref 65–99)
Glucose-Capillary: 134 mg/dL — ABNORMAL HIGH (ref 65–99)
Glucose-Capillary: 158 mg/dL — ABNORMAL HIGH (ref 65–99)
Glucose-Capillary: 84 mg/dL (ref 65–99)

## 2015-11-12 LAB — CBC
HCT: 29.6 % — ABNORMAL LOW (ref 36.0–46.0)
HEMOGLOBIN: 9.7 g/dL — AB (ref 12.0–15.0)
MCH: 30.2 pg (ref 26.0–34.0)
MCHC: 32.8 g/dL (ref 30.0–36.0)
MCV: 92.2 fL (ref 78.0–100.0)
PLATELETS: 199 10*3/uL (ref 150–400)
RBC: 3.21 MIL/uL — ABNORMAL LOW (ref 3.87–5.11)
RDW: 14.1 % (ref 11.5–15.5)
WBC: 16.3 10*3/uL — ABNORMAL HIGH (ref 4.0–10.5)

## 2015-11-12 MED ORDER — LABETALOL HCL 100 MG PO TABS
200.0000 mg | ORAL_TABLET | Freq: Once | ORAL | Status: AC
  Administered 2015-11-12: 200 mg via ORAL
  Filled 2015-11-12: qty 2

## 2015-11-12 MED ORDER — LABETALOL HCL 300 MG PO TABS
400.0000 mg | ORAL_TABLET | Freq: Three times a day (TID) | ORAL | Status: DC
  Administered 2015-11-13 – 2015-11-15 (×7): 400 mg via ORAL
  Filled 2015-11-12 (×7): qty 1

## 2015-11-12 NOTE — Progress Notes (Signed)
Patient ID: LASHARA MILLS, female   DOB: [birth year], 35 y.o.   MRN: [id number] Patient ID: ADALEEN SARDUY, female   DOB: 08-Jun-1980, 35 y.o.   MRN: [id number] FACULTY PRACTICE ANTEPARTUM NOTE  ONEDA GOWARD is a 35 y.o. [id number] at [redacted]w[redacted]d  who is admitted for preeclampsia with severe features.   Fetal presentation is cephalic. Length of Stay:  2  Days  Subjective:  Patient reports good fetal movement.   She reports no uterine contractions She reports no bleeding  She reports no loss of fluid per vagina.  Vitals:  Blood pressure (!) 163/85, pulse 95, temperature 98.2 F (36.8 C), temperature source Oral, resp. rate 18, height 5\' 6"  (1.676 m), weight 184 lb (83.5 kg), last menstrual period 03/03/2015, SpO2 100 %. Physical Examination:  General appearance - alert, well appearing, and in no distress Chest - clear to auscultation, no wheezes, rales or rhonchi, symmetric air entry Heart - normal rate, regular rhythm, normal S1, S2, no murmurs, rubs, clicks or gallops Abdomen - soft, nontender, nondistended, no masses or organomegaly Fundal Height:  size equals dates Extremities: extremities normal, atraumatic, no cyanosis or edema with DTRs 3+ bilaterally Membranes:intact  Fetal Monitoring: NST x2 reviewed.  Both tracings: Baseline: 135 bpm, Variability: Good {> 6 bpm), Accelerations: Reactive and Decelerations: Absent  Labs:  Results for orders placed or performed during the hospital encounter of 11/10/15 (from the past 24 hour(s))  Glucose, capillary   Collection Time: 11/11/15  8:49 AM  Result Value Ref Range   Glucose-Capillary 137 (H) 65 - 99 mg/dL  Glucose, capillary   Collection Time: 11/11/15 10:55 AM  Result Value Ref Range   Glucose-Capillary 168 (H) 65 - 99 mg/dL   Comment 1 Notify RN    Comment 2 Document in Chart   OB RESULT CONSOLE Group B Strep   Collection Time: 11/11/15  1:00 PM  Result Value Ref Range   GBS Positive   OB RESULT CONSOLE Group B Strep   Collection Time: 11/11/15  1:00 PM  Result Value Ref Range   GBS Positive   Glucose, capillary   Collection Time: 11/11/15  1:18 PM  Result Value Ref Range   Glucose-Capillary 123 (H) 65 - 99 mg/dL   Comment 1 Notify RN    Comment 2 Document in Chart   Glucose, capillary   Collection Time: 11/11/15  3:23 PM  Result Value Ref Range   Glucose-Capillary 184 (H) 65 - 99 mg/dL   Comment 1 Notify RN    Comment 2 Document in Chart   Glucose, capillary   Collection Time: 11/11/15  7:07 PM  Result Value Ref Range   Glucose-Capillary 167 (H) 65 - 99 mg/dL   Comment 1 Notify RN    Comment 2 Document in Chart   Glucose, capillary   Collection Time: 11/11/15  9:00 PM  Result Value Ref Range   Glucose-Capillary 188 (H) 65 - 99 mg/dL    Imaging Studies:    UA dopplers and BPP today.   Medications:  Scheduled . docusate sodium  100 mg Oral Daily  . insulin aspart  0-20 Units Subcutaneous TID WC  . insulin aspart  4 Units Subcutaneous TID WC  . labetalol  200 mg Oral Q8H  . nicotine  14 mg Transdermal Daily  . pantoprazole  40 mg Oral Daily  . prenatal multivitamin  1 tablet Oral Q1200  . sodium chloride flush  3 mL Intravenous Q12H   I have reviewed the patient's  current medications.  ASSESSMENT: Patient Active Problem List   Diagnosis Date Noted  . IUGR (intrauterine growth restriction) affecting care of mother 11/11/2015  . Preeclampsia, severe 11/10/2015  . Gestational diabetes mellitus (GDM) in third trimester 10/14/2015  . Anemia affecting pregnancy 07/27/2015  . Supervision of high risk pregnancy in third trimester 05/17/2015  . AMA (advanced maternal age) multigravida 35+ 05/17/2015    PLAN: 1.  Preeclampsia with Severe features  BPs better controlled  Continue Labetalol  q8  24hr urine 230  Steroids completed  Delivery at 34 weeks.  Continue NST twice daily  NSTs reactive x2 2.  GDM  Previously diet controlled - elevated from BMZ  CBGs AC and 2hrs  postprandial  SSI with 4 units of insulin premeal 3.  IUGR  Dopplers and BPP today  AC < 3% 4.  [redacted] week Gestation  GBS culture pending.  Continue routine antenatal care.   Lazaro Arms, MD 11/12/2015,7:52 AM

## 2015-11-13 DIAGNOSIS — O1413 Severe pre-eclampsia, third trimester: Secondary | ICD-10-CM

## 2015-11-13 DIAGNOSIS — Z3A32 32 weeks gestation of pregnancy: Secondary | ICD-10-CM

## 2015-11-13 LAB — COMPREHENSIVE METABOLIC PANEL
ALBUMIN: 3.2 g/dL — AB (ref 3.5–5.0)
ALK PHOS: 70 U/L (ref 38–126)
ALT: 12 U/L — AB (ref 14–54)
ANION GAP: 6 (ref 5–15)
AST: 17 U/L (ref 15–41)
BUN: 14 mg/dL (ref 6–20)
CHLORIDE: 110 mmol/L (ref 101–111)
CO2: 21 mmol/L — AB (ref 22–32)
Calcium: 8.8 mg/dL — ABNORMAL LOW (ref 8.9–10.3)
Creatinine, Ser: 0.75 mg/dL (ref 0.44–1.00)
GFR calc non Af Amer: >60 mL/min (ref >60)
GLUCOSE: 131 mg/dL — AB (ref 65–99)
Potassium: 4.3 mmol/L (ref 3.5–5.1)
SODIUM: 137 mmol/L (ref 135–145)
Total Bilirubin: 0.5 mg/dL (ref 0.3–1.2)
Total Protein: 5.7 g/dL — ABNORMAL LOW (ref 6.5–8.1)

## 2015-11-13 LAB — GLUCOSE, CAPILLARY
GLUCOSE-CAPILLARY: 94 mg/dL (ref 65–99)
GLUCOSE-CAPILLARY: 103 mg/dL — AB (ref 65–99)
GLUCOSE-CAPILLARY: 152 mg/dL — AB (ref 65–99)
Glucose-Capillary: 89 mg/dL (ref 65–99)
Glucose-Capillary: 93 mg/dL (ref 65–99)
Glucose-Capillary: 120 mg/dL — ABNORMAL HIGH (ref 65–99)

## 2015-11-13 LAB — TYPE AND SCREEN
ABO/RH(D): O POS
ANTIBODY SCREEN: NEGATIVE

## 2015-11-13 NOTE — Progress Notes (Signed)
Patient ID: Kara Brooks, female   DOB: 07/26/1980, 35 y.o.   MRN: [id number] FACULTY PRACTICE ANTEPARTUM(COMPREHENSIVE) NOTE  Kara Brooks is a 35 y.o. [id number] at 10337w2d by best clinical estimate who is admitted for pre-eclampsia.   Fetal presentation is cephalic. Length of Stay:  3  Days  Subjective: Did not sleep well last night. Patient reports the fetal movement as active. Patient reports uterine contraction  activity as none. Patient reports  vaginal bleeding as none. Patient describes fluid per vagina as None.  Vitals:  Blood pressure (!) 156/89, pulse 82, temperature 98.8 F (37.1 C), temperature source Oral, resp. rate 18, height 5\' 6"  (1.676 m), weight 184 lb (83.5 kg), last menstrual period 03/03/2015, SpO2 100 %. Physical Examination:  General appearance - alert, well appearing, and in no distress Chest - normal effort Abdomen - gravid, NT Neurological - DTR's normal and symmetric Fundal Height:  size equals dates Extremities: extremities normal, atraumatic, no cyanosis or edema  Membranes:intact  Fetal Monitoring:  Baseline: 135 bpm, Variability: Good {> 6 bpm), Accelerations: Reactive and Decelerations: Absent  Labs:  Results for orders placed or performed during the hospital encounter of 11/10/15 (from the past 24 hour(s))  Glucose, capillary   Collection Time: 11/12/15 10:34 AM  Result Value Ref Range   Glucose-Capillary 134 (H) 65 - 99 mg/dL   Comment 1 Notify RN   Glucose, capillary   Collection Time: 11/12/15 12:44 PM  Result Value Ref Range   Glucose-Capillary 158 (H) 65 - 99 mg/dL   Comment 1 Notify RN   Glucose, capillary   Collection Time: 11/12/15  2:30 PM  Result Value Ref Range   Glucose-Capillary 84 65 - 99 mg/dL   Comment 1 Notify RN   Glucose, capillary   Collection Time: 11/12/15  4:33 PM  Result Value Ref Range   Glucose-Capillary 148 (H) 65 - 99 mg/dL  Glucose, capillary   Collection Time: 11/12/15  9:37 PM  Result Value Ref Range    Glucose-Capillary 116 (H) 65 - 99 mg/dL  Glucose, capillary   Collection Time: 11/12/15 11:34 PM  Result Value Ref Range   Glucose-Capillary 143 (H) 65 - 99 mg/dL  CBC   Collection Time: 11/12/15 11:44 PM  Result Value Ref Range   WBC 16.3 (H) 4.0 - 10.5 K/uL   RBC 3.21 (L) 3.87 - 5.11 MIL/uL   Hemoglobin 9.7 (L) 12.0 - 15.0 g/dL   HCT 03.429.6 (L) 74.236.0 - 59.546.0 %   MCV 92.2 78.0 - 100.0 fL   MCH 30.2 26.0 - 34.0 pg   MCHC 32.8 30.0 - 36.0 g/dL   RDW 63.814.1 75.611.5 - 43.315.5 %   Platelets 199 150 - 400 K/uL  Comprehensive metabolic panel   Collection Time: 11/12/15 11:44 PM  Result Value Ref Range   Sodium 137 135 - 145 mmol/L   Potassium 4.3 3.5 - 5.1 mmol/L   Chloride 110 101 - 111 mmol/L   CO2 21 (L) 22 - 32 mmol/L   Glucose, Bld 131 (H) 65 - 99 mg/dL   BUN 14 6 - 20 mg/dL   Creatinine, Ser 2.950.75 0.44 - 1.00 mg/dL   Calcium 8.8 (L) 8.9 - 10.3 mg/dL   Total Protein 5.7 (L) 6.5 - 8.1 g/dL   Albumin 3.2 (L) 3.5 - 5.0 g/dL   AST 17 15 - 41 U/L   ALT 12 (L) 14 - 54 U/L   Alkaline Phosphatase 70 38 - 126 U/L   Total Bilirubin 0.5  0.3 - 1.2 mg/dL   GFR calc non Af Amer >60 >60 mL/min   GFR calc Af Amer >60 >60 mL/min   Anion gap 6 5 - 15      Medications:  Scheduled . docusate sodium  100 mg Oral Daily  . insulin aspart  0-20 Units Subcutaneous TID WC  . insulin aspart  4 Units Subcutaneous TID WC  . labetalol  400 mg Oral Q8H  . nicotine  14 mg Transdermal Daily  . pantoprazole  40 mg Oral Daily  . prenatal multivitamin  1 tablet Oral Q1200  . sodium chloride flush  3 mL Intravenous Q12H   I have reviewed the patient's current medications.  ASSESSMENT: Principal Problem:   Preeclampsia, severe Active Problems:   Supervision of high risk pregnancy in third trimester   AMA (advanced maternal age) multigravida 35+   Anemia affecting pregnancy   Gestational diabetes mellitus (GDM) in third trimester   IUGR (intrauterine growth restriction) affecting care of  mother   PLAN: Continue increased dose of Labetalol--seems to be controlling BP at this stage.  Reva Bores, MD 11/13/2015,7:46 AM

## 2015-11-13 NOTE — Progress Notes (Signed)
Patient ID: Kara Brooks, female   DOB: May 07, 1980, 35 y.o.   MRN: [id number]   Subjective: Interval History: feel back pain and dizziness. Out to smoke this afternoon.  Objective: Vital signs in last 24 hours: Temp:  [97.8 F (36.6 C)-98.9 F (37.2 C)] 98.8 F (37.1 C) (08/05 2125) Pulse Rate:  [87-101] 88 (08/05 2125) Resp:  [16-18] 18 (08/05 2125) BP: (138-168)/(68-86) 168/86 (08/05 2125)  General appearance: alert, cooperative and appears stated age Head: Normocephalic, without obvious abnormality, atraumatic Neck: supple, symmetrical, trachea midline Lungs: normal effort Heart: regular rate and rhythm Abdomen: gravid, NT Extremities: Homans sign is negative, no sign of DVT Neurologic: Reflexes: 2+ and symmetric no clonus  Results for orders placed or performed during the hospital encounter of 11/10/15 (from the past 24 hour(s))  Glucose, capillary     Status: Abnormal   Collection Time: 11/12/15 10:34 AM  Result Value Ref Range   Glucose-Capillary 134 (H) 65 - 99 mg/dL   Comment 1 Notify RN   Glucose, capillary     Status: Abnormal   Collection Time: 11/12/15 12:44 PM  Result Value Ref Range   Glucose-Capillary 158 (H) 65 - 99 mg/dL   Comment 1 Notify RN   Glucose, capillary     Status: None   Collection Time: 11/12/15  2:30 PM  Result Value Ref Range   Glucose-Capillary 84 65 - 99 mg/dL   Comment 1 Notify RN   Glucose, capillary     Status: Abnormal   Collection Time: 11/12/15  4:33 PM  Result Value Ref Range   Glucose-Capillary 148 (H) 65 - 99 mg/dL  Glucose, capillary     Status: Abnormal   Collection Time: 11/12/15  9:37 PM  Result Value Ref Range   Glucose-Capillary 116 (H) 65 - 99 mg/dL  Glucose, capillary     Status: Abnormal   Collection Time: 11/12/15 11:34 PM  Result Value Ref Range   Glucose-Capillary 143 (H) 65 - 99 mg/dL  CBC     Status: Abnormal   Collection Time: 11/12/15 11:44 PM  Result Value Ref Range   WBC 16.3 (H) 4.0 - 10.5 K/uL   RBC 3.21 (L) 3.87 - 5.11 MIL/uL   Hemoglobin 9.7 (L) 12.0 - 15.0 g/dL   HCT 82.929.6 (L) 56.236.0 - 13.046.0 %   MCV 92.2 78.0 - 100.0 fL   MCH 30.2 26.0 - 34.0 pg   MCHC 32.8 30.0 - 36.0 g/dL   RDW 86.514.1 78.411.5 - 69.615.5 %   Platelets 199 150 - 400 K/uL     Scheduled Meds: . docusate sodium  100 mg Oral Daily  . insulin aspart  0-20 Units Subcutaneous TID WC  . insulin aspart  4 Units Subcutaneous TID WC  . labetalol  400 mg Oral Q8H  . nicotine  14 mg Transdermal Daily  . pantoprazole  40 mg Oral Daily  . prenatal multivitamin  1 tablet Oral Q1200  . sodium chloride flush  3 mL Intravenous Q12H   Continuous Infusions: . lactated ringers Stopped (11/11/15 1615)   PRN Meds:acetaminophen, calcium carbonate, zolpidem  Assessment/Plan: Principal Problem:   Preeclampsia, severe Active Problems:   Supervision of high risk pregnancy in third trimester   AMA (advanced maternal age) multigravida 35+   Anemia affecting pregnancy   Gestational diabetes mellitus (GDM) in third trimester   IUGR (intrauterine growth restriction) affecting care of mother  Repeat labs Increase labetalol to 400 mg tid IV hydralazine to regimen Delivery at 34 wks.  LOS: 3 days   PRATT,TANYA S, MD 11/13/2015 12:00 AM

## 2015-11-14 DIAGNOSIS — O1413 Severe pre-eclampsia, third trimester: Secondary | ICD-10-CM

## 2015-11-14 DIAGNOSIS — Z3A33 33 weeks gestation of pregnancy: Secondary | ICD-10-CM

## 2015-11-14 LAB — GLUCOSE, CAPILLARY
GLUCOSE-CAPILLARY: 102 mg/dL — AB (ref 65–99)
GLUCOSE-CAPILLARY: 118 mg/dL — AB (ref 65–99)
GLUCOSE-CAPILLARY: 136 mg/dL — AB (ref 65–99)
Glucose-Capillary: 91 mg/dL (ref 65–99)
Glucose-Capillary: 78 mg/dL (ref 65–99)
Glucose-Capillary: 141 mg/dL — ABNORMAL HIGH (ref 65–99)

## 2015-11-14 LAB — AMNISURE RUPTURE OF MEMBRANE (ROM) NOT AT ARMC: AMNISURE: NEGATIVE

## 2015-11-14 NOTE — Progress Notes (Signed)
Patient ID: Kara LynnSamantha E Knueppel, female   DOB: 1981/03/28, 35 y.o.   MRN: [id number] Patient ID: Kara LynnSamantha E Ozbun, female   DOB: 1981/03/28, 35 y.o.   MRN: [id number] FACULTY PRACTICE ANTEPARTUM(COMPREHENSIVE) NOTE  Kara Brooks is a 35 y.o. [id number] at 3855w3d  by best clinical estimate who is admitted for pre-eclampsia.   Fetal presentation is cephalic. Length of Stay:  4  Days  Subjective: Slept great last nigth Patient reports the fetal movement as active. Patient reports uterine contraction  activity as none. Patient reports  vaginal bleeding as none. Patient describes fluid per vagina as None.  Vitals:  Blood pressure 139/79, pulse 71, temperature 98.3 F (36.8 C), temperature source Oral, resp. rate 18, height 5\' 6"  (1.676 m), weight 184 lb (83.5 kg), last menstrual period 03/03/2015, SpO2 100 %. Physical Examination:  General appearance - alert, well appearing, and in no distress Chest - normal effort Abdomen - gravid, NT Neurological - DTR's normal and symmetric Fundal Height:  size equals dates Extremities: extremities normal, atraumatic, no cyanosis or edema  Membranes:intact  Fetal Monitoring:  Baseline: 135 bpm, Variability: Good {> 6 bpm), Accelerations: Reactive and Decelerations: Absent Had a variable but resolved yesterday Labs:  Results for orders placed or performed during the hospital encounter of 11/10/15 (from the past 24 hour(s))  Glucose, capillary   Collection Time: 11/13/15 10:14 AM  Result Value Ref Range   Glucose-Capillary 94 65 - 99 mg/dL   Comment 1 Notify RN   Glucose, capillary   Collection Time: 11/13/15 12:44 PM  Result Value Ref Range   Glucose-Capillary 103 (H) 65 - 99 mg/dL   Comment 1 Notify RN   [facility]   Collection Time: 11/13/15 12:52 PM  Result Value Ref Range   ABO/RH(D) O POS    Antibody Screen NEG    Sample Expiration 11/16/2015   Glucose, capillary   Collection Time: 11/13/15  1:41 PM   Result Value Ref Range   Glucose-Capillary 89 65 - 99 mg/dL   Comment 1 Notify RN   Glucose, capillary   Collection Time: 11/13/15  3:55 PM  Result Value Ref Range   Glucose-Capillary 93 65 - 99 mg/dL   Comment 1 Notify RN   Glucose, capillary   Collection Time: 11/13/15  7:25 PM  Result Value Ref Range   Glucose-Capillary 120 (H) 65 - 99 mg/dL  Glucose, capillary   Collection Time: 11/13/15  9:46 PM  Result Value Ref Range   Glucose-Capillary 152 (H) 65 - 99 mg/dL      Medications:  Scheduled . docusate sodium  100 mg Oral Daily  . insulin aspart  0-20 Units Subcutaneous TID WC  . insulin aspart  4 Units Subcutaneous TID WC  . labetalol  400 mg Oral Q8H  . nicotine  14 mg Transdermal Daily  . pantoprazole  40 mg Oral Daily  . prenatal multivitamin  1 tablet Oral Q1200  . sodium chloride flush  3 mL Intravenous Q12H   I have reviewed the patient's current medications.  ASSESSMENT: Principal Problem:   Preeclampsia, severe Active Problems:   Supervision of high risk pregnancy in third trimester   AMA (advanced maternal age) multigravida 35+   Anemia affecting pregnancy   Gestational diabetes mellitus (GDM) in third trimester   IUGR (intrauterine growth restriction) affecting care of mother   PLAN: Continue increased dose of Labetalol--seems to be controlling BP at this stage. Plan delivery at 34 weeks unless otherwise  clinically indicated  Lazaro Arms, MD 11/14/2015,7:10 AM

## 2015-11-15 ENCOUNTER — Encounter: Payer: 59 | Admitting: Family Medicine

## 2015-11-15 DIAGNOSIS — Z3A33 33 weeks gestation of pregnancy: Secondary | ICD-10-CM

## 2015-11-15 DIAGNOSIS — O1413 Severe pre-eclampsia, third trimester: Secondary | ICD-10-CM

## 2015-11-15 LAB — GLUCOSE, CAPILLARY
GLUCOSE-CAPILLARY: 112 mg/dL — AB (ref 65–99)
GLUCOSE-CAPILLARY: 119 mg/dL — AB (ref 65–99)
Glucose-Capillary: 106 mg/dL — ABNORMAL HIGH (ref 65–99)
Glucose-Capillary: 101 mg/dL — ABNORMAL HIGH (ref 65–99)

## 2015-11-15 MED ORDER — HYDRALAZINE HCL 20 MG/ML IJ SOLN
5.0000 mg | INTRAMUSCULAR | Status: AC | PRN
Start: 2015-11-15 — End: 2015-11-15
  Administered 2015-11-15 (×2): 5 mg via INTRAVENOUS
  Filled 2015-11-15 (×2): qty 1

## 2015-11-15 MED ORDER — LABETALOL HCL 5 MG/ML IV SOLN
20.0000 mg | INTRAVENOUS | Status: AC | PRN
  Administered 2015-11-15: 40 mg via INTRAVENOUS
  Administered 2015-11-15: 20 mg via INTRAVENOUS
  Filled 2015-11-15: qty 8
  Filled 2015-11-15: qty 4

## 2015-11-15 MED ORDER — LACTATED RINGERS IV BOLUS (SEPSIS)
500.0000 mL | Freq: Once | INTRAVENOUS | Status: AC
  Administered 2015-11-15: 500 mL via INTRAVENOUS

## 2015-11-15 MED ORDER — LABETALOL HCL 100 MG PO TABS
200.0000 mg | ORAL_TABLET | Freq: Once | ORAL | Status: AC
  Administered 2015-11-15: 200 mg via ORAL
  Filled 2015-11-15: qty 2

## 2015-11-15 MED ORDER — LABETALOL HCL 200 MG PO TABS
600.0000 mg | ORAL_TABLET | Freq: Three times a day (TID) | ORAL | Status: DC
  Administered 2015-11-15 – 2015-11-18 (×10): 600 mg via ORAL
  Filled 2015-11-15 (×13): qty 2

## 2015-11-15 NOTE — Plan of Care (Signed)
Problem: Physical Regulation: Goal: Complications related to the disease process, condition or treatment will be avoided or minimized Outcome: Progressing Patient updated and educated on the risks of SMOKING while pregnant

## 2015-11-15 NOTE — Progress Notes (Signed)
Notified Dr. Jolayne Pantheronstant of patient's request to be placed on monitor because she was feeling contractions.  See new order.

## 2015-11-15 NOTE — Progress Notes (Signed)
FACULTY PRACTICE ANTEPARTUM(COMPREHENSIVE) NOTE  Kara Brooks is a 35 y.o. [id number] at 3552w4d admitted for preeclampsia.   Fetal presentation is cephalic. Length of Stay:  5  Days  Subjective:  Patient reports the fetal movement as active. Patient reports uterine contraction  activity as none. Patient reports  vaginal bleeding as none. Patient describes fluid per vagina as None.  Vitals:  Blood pressure (!) 163/91, pulse 82, temperature 99 F (37.2 C), temperature source Oral, resp. rate 20, height 5\' 6"  (1.676 m), weight 184 lb (83.5 kg), last menstrual period 03/03/2015, SpO2 100 %. Physical Examination:  General appearance - alert, well appearing, and in no distress Heart - normal rate and regular rhythm Abdomen - soft, nontender, nondistended Fundal Height:  size equals dates Extremities: extremities normal, atraumatic, no cyanosis or edema and Homans sign is negative, no sign of DVT with DTRs 2+ bilaterally Membranes:intact  Fetal Monitoring:     Fetal Heart Rate A  Mode -- [off] filed at 11/14/2015 2300  Baseline Rate (A) 130 bpm filed at 11/14/2015 2224  Variability 6-25 BPM filed at 11/14/2015 2224  Accelerations 15 x 15 filed at 11/14/2015 2224  Decelerations None filed at 11/14/2015 2224     Labs:  Results for orders placed or performed during the hospital encounter of 11/10/15 (from the past 24 hour(s))  Glucose, capillary   Collection Time: 11/14/15  9:02 AM  Result Value Ref Range   Glucose-Capillary 91 65 - 99 mg/dL   Comment 1 Notify RN   Glucose, capillary   Collection Time: 11/14/15 11:10 AM  Result Value Ref Range   Glucose-Capillary 102 (H) 65 - 99 mg/dL   Comment 1 Notify RN    Comment 2 Document in Chart   [facility]   Collection Time: 11/14/15 11:30 AM  Result Value Ref Range   Amnisure ROM NEGATIVE   Glucose, capillary   Collection Time: 11/14/15 12:06 PM  Result Value Ref Range   Glucose-Capillary 78 65  - 99 mg/dL   Comment 1 Notify RN    Comment 2 Document in Chart   Glucose, capillary   Collection Time: 11/14/15  2:21 PM  Result Value Ref Range   Glucose-Capillary 141 (H) 65 - 99 mg/dL   Comment 1 Notify RN    Comment 2 Document in Chart   Glucose, capillary   Collection Time: 11/14/15  8:31 PM  Result Value Ref Range   Glucose-Capillary 118 (H) 65 - 99 mg/dL  Glucose, capillary   Collection Time: 11/14/15 10:24 PM  Result Value Ref Range   Glucose-Capillary 136 (H) 65 - 99 mg/dL    Imaging Studies:     Currently EPIC will not allow sonographic studies to automatically populate into notes.  In the meantime, copy and paste results into note or free text.  Medications:  Scheduled . docusate sodium  100 mg Oral Daily  . insulin aspart  0-20 Units Subcutaneous TID WC  . insulin aspart  4 Units Subcutaneous TID WC  . labetalol  400 mg Oral Q8H  . nicotine  14 mg Transdermal Daily  . pantoprazole  40 mg Oral Daily  . prenatal multivitamin  1 tablet Oral Q1200  . sodium chloride flush  3 mL Intravenous Q12H   I have reviewed the patient's current medications.  ASSESSMENT: Patient Active Problem List   Diagnosis Date Noted  . IUGR (intrauterine growth restriction) affecting care of mother 11/11/2015  . Preeclampsia, severe 11/10/2015  .  Gestational diabetes mellitus (GDM) in third trimester 10/14/2015  . Anemia affecting pregnancy 07/27/2015  . Supervision of high risk pregnancy in third trimester 05/17/2015  . AMA (advanced maternal age) multigravida 35+ 05/17/2015    PLAN: BP elevated apresoline protocol ordered, may need to adjust her labetalol dose  Damir Leung 11/15/2015,7:23 AM

## 2015-11-16 DIAGNOSIS — O1413 Severe pre-eclampsia, third trimester: Secondary | ICD-10-CM

## 2015-11-16 DIAGNOSIS — Z3A33 33 weeks gestation of pregnancy: Secondary | ICD-10-CM

## 2015-11-16 LAB — GLUCOSE, CAPILLARY
GLUCOSE-CAPILLARY: 92 mg/dL (ref 65–99)
GLUCOSE-CAPILLARY: 132 mg/dL — AB (ref 65–99)
GLUCOSE-CAPILLARY: 76 mg/dL (ref 65–99)
GLUCOSE-CAPILLARY: 135 mg/dL — AB (ref 65–99)
Glucose-Capillary: 122 mg/dL — ABNORMAL HIGH (ref 65–99)
Glucose-Capillary: 120 mg/dL — ABNORMAL HIGH (ref 65–99)
Glucose-Capillary: 109 mg/dL — ABNORMAL HIGH (ref 65–99)

## 2015-11-16 NOTE — Consult Note (Signed)
Neonatology Consult  Note:  At the request of the patients obstetrician Dr. Roselie Awkward I met with Kara Brooks who is at 58 5 weeks currently with pregnancy complicated by preeclampsia, IUGR, GDM and AMA.  S/p betamethasone 8/3-4.  Anticipate IOL at 34 weeks.    We reviewed initial delivery room management, including CPAP, [place], and low but certainly possible need for intubation for surfactant administration.  We discussed feeding immaturity and need for full po intake with multiple days of good weight gain and no apnea or bradycardia before discharge.  We reviewed increased risk of jaundice, infection, and temperature instability.   Discussed likely length of stay.  Thank you for allowing [place] to participate in her care.  Please call with questions. Higinio Roger, DO   Neonatologist  The total length of face-to-face or floor / unit time for this encounter was 20 minutes.  Counseling and / or coordination of care was greater than fifty percent of the time.

## 2015-11-16 NOTE — Progress Notes (Signed)
FACULTY PRACTICE ANTEPARTUM(COMPREHENSIVE) NOTE  NASHALY DORANTES is a 35 y.o. Z6X0960 at [redacted]w[redacted]d by early ultrasound who is admitted for preeclampsia.   Fetal presentation is cephalic. Length of Stay:  6  Days  Subjective:  Patient reports the fetal movement as active. Patient reports uterine contraction  activity as occasional. Patient reports  vaginal bleeding as none. Patient describes fluid per vagina as None.  Vitals:  Blood pressure 137/81, pulse 81, temperature 97.3 F (36.3 C), temperature source Oral, resp. rate 16, height  (1.676 m), weight 186 lb 9.6 oz (84.6 kg), last menstrual period 03/03/2015, SpO2 100 %. Physical Examination:  General appearance - alert, well appearing, and in no distress Heart - normal rate and regular rhythm Abdomen - soft, nontender, nondistended Fundal Height:  size equals dates Cervical Exam: Not evaluated. Extremities: extremities normal, atraumatic, no cyanosis or edema and Homans sign is negative, no sign of DVT with DTRs 2+ bilaterally Membranes:intact  Fetal Monitoring:     Fetal Heart Rate A  Mode -- [off] filed at 11/15/2015 2218  Baseline Rate (A) 135 bpm filed at 11/15/2015 2116  Variability 6-25 BPM filed at 11/15/2015 2116  Accelerations 15 x 15 filed at 11/15/2015 2116  Decelerations None filed at 11/15/2015 2116     Labs:  Results for orders placed or performed during the hospital encounter of 11/10/15 (from the past 24 hour(s))  Glucose, capillary   Collection Time: 11/15/15 10:44 AM  Result Value Ref Range   Glucose-Capillary 112 (H) 65 - 99 mg/dL  Glucose, capillary   Collection Time: 11/15/15 12:52 PM  Result Value Ref Range   Glucose-Capillary 106 (H) 65 - 99 mg/dL   Comment 1 Notify RN    Comment 2 Document in Chart   Glucose, capillary   Collection Time: 11/15/15  4:45 PM  Result Value Ref Range   Glucose-Capillary 101 (H) 65 - 99 mg/dL   Comment 1 Notify RN    Comment 2 Document in Chart   Glucose,  capillary   Collection Time: 11/15/15  7:22 PM  Result Value Ref Range   Glucose-Capillary 119 (H) 65 - 99 mg/dL   Comment 1 Notify RN    Comment 2 Document in Chart   Glucose, capillary   Collection Time: 11/15/15 11:41 PM  Result Value Ref Range   Glucose-Capillary 122 (H) 65 - 99 mg/dL  Glucose, capillary   Collection Time: 11/16/15  8:39 AM  Result Value Ref Range   Glucose-Capillary 92 65 - 99 mg/dL   Comment 1 Notify RN    Comment 2 Document in Chart     Imaging Studies:     Currently EPIC will not allow sonographic studies to automatically populate into notes.  In the meantime, copy and paste results into note or free text.  Medications:  Scheduled . docusate sodium  100 mg Oral Daily  . insulin aspart  0-20 Units Subcutaneous TID WC  . insulin aspart  4 Units Subcutaneous TID WC  . labetalol  600 mg Oral Q8H  . nicotine  14 mg Transdermal Daily  . pantoprazole  40 mg Oral Daily  . prenatal multivitamin  1 tablet Oral Q1200  . sodium chloride flush  3 mL Intravenous Q12H   I have reviewed the patient's current medications.  ASSESSMENT: Patient Active Problem List   Diagnosis Date Noted  . IUGR (intrauterine growth restriction) affecting care of mother 11/11/2015  . Preeclampsia, severe 11/10/2015  . Gestational diabetes mellitus (GDM) in third trimester  10/14/2015  . Anemia affecting pregnancy 07/27/2015  . Supervision of high risk pregnancy in third trimester 05/17/2015  . AMA (advanced maternal age) multigravida 35+ 05/17/2015    PLAN: Continue present BP management and anticipate IOL 34 weeks  ARNOLD,JAMES 11/16/2015,9:19 AM

## 2015-11-17 LAB — GLUCOSE, CAPILLARY
GLUCOSE-CAPILLARY: 101 mg/dL — AB (ref 65–99)
GLUCOSE-CAPILLARY: 113 mg/dL — AB (ref 65–99)
GLUCOSE-CAPILLARY: 112 mg/dL — AB (ref 65–99)
GLUCOSE-CAPILLARY: 153 mg/dL — AB (ref 65–99)
Glucose-Capillary: 90 mg/dL (ref 65–99)
Glucose-Capillary: 73 mg/dL (ref 65–99)

## 2015-11-17 MED ORDER — MAGNESIUM HYDROXIDE 400 MG/5ML PO SUSP
30.0000 mL | Freq: Every day | ORAL | Status: DC | PRN
  Administered 2015-11-17: 30 mL via ORAL
  Filled 2015-11-17: qty 30

## 2015-11-17 NOTE — Progress Notes (Signed)
FACULTY PRACTICE ANTEPARTUM(COMPREHENSIVE) NOTE  Kara Brooks is a 35 y.o. Z6X0960 at [redacted]w[redacted]d by early ultrasound who is admitted for severe preeclampsia.   Fetal presentation is cephalic. Length of Stay:  7  Days  Subjective: Denies any headaches, visual symptoms or abdominal/RUQ pain Patient reports the fetal movement as active. Patient reports uterine contraction  activity as occasional. Patient reports  vaginal bleeding as none. Patient describes fluid per vagina as None.  Vitals:  Blood pressure (!) 150/88, pulse 90, temperature 98.2 F (36.8 C), temperature source Oral, resp. rate 18, height  (1.676 m), weight 186 lb 9.6 oz (84.6 kg), last menstrual period 03/03/2015, SpO2 100 %. Physical Examination:  General appearance - alert, well appearing, and in no distress Heart - normal rate and regular rhythm Abdomen - soft, nontender, nondistended Fundal Height:  size equals dates Cervical Exam: Not evaluated. Extremities: extremities normal, atraumatic, no cyanosis or edema and Homans sign is negative, no sign of DVT with DTRs 2+ bilaterally Membranes:intact  Fetal Monitoring:     Fetal Heart Rate A  Baseline Rate (A) 135 bpm  Variability 6-25 BPM   Accelerations 15 x 15  Decelerations None      Labs:  Results for orders placed or performed during the hospital encounter of 11/10/15 (from the past 24 hour(s))  Glucose, capillary   Collection Time: 11/16/15 12:04 PM  Result Value Ref Range   Glucose-Capillary 132 (H) 65 - 99 mg/dL   Comment 1 Notify RN    Comment 2 Document in Chart   Type and screen [facility]   Collection Time: 11/16/15  2:12 PM  Result Value Ref Range   ABO/RH(D) O POS    Antibody Screen NEG    Sample Expiration 11/19/2015   Glucose, capillary   Collection Time: 11/16/15  3:02 PM  Result Value Ref Range   Glucose-Capillary 76 65 - 99 mg/dL   Comment 1 Notify RN    Comment 2 Document in Chart   Glucose, capillary   Collection Time: 11/16/15  5:09 PM  Result Value Ref Range   Glucose-Capillary 120 (H) 65 - 99 mg/dL   Comment 1 Notify RN    Comment 2 Document in Chart   Glucose, capillary   Collection Time: 11/16/15  7:41 PM  Result Value Ref Range   Glucose-Capillary 109 (H) 65 - 99 mg/dL  Glucose, capillary   Collection Time: 11/16/15 10:03 PM  Result Value Ref Range   Glucose-Capillary 135 (H) 65 - 99 mg/dL  Glucose, capillary   Collection Time: 11/17/15  6:31 AM  Result Value Ref Range   Glucose-Capillary 90 65 - 99 mg/dL    Imaging Studies:     Currently EPIC will not allow sonographic studies to automatically populate into notes.  In the meantime, copy and paste results into note or free text.  Medications:  Scheduled . docusate sodium  100 mg Oral Daily  . insulin aspart  0-20 Units Subcutaneous TID WC  . insulin aspart  4 Units Subcutaneous TID WC  . labetalol  600 mg Oral Q8H  . nicotine  14 mg Transdermal Daily  . pantoprazole  40 mg Oral Daily  . prenatal multivitamin  1 tablet Oral Q1200  . sodium chloride flush  3 mL Intravenous Q12H   I have reviewed the patient's current medications.  ASSESSMENT: Patient Active Problem List   Diagnosis Date Noted  . IUGR (intrauterine growth restriction) affecting care of mother 11/11/2015  . Preeclampsia, severe  11/10/2015  . Gestational diabetes mellitus (GDM) in third trimester 10/14/2015  . Anemia affecting pregnancy 07/27/2015  . Supervision of high risk pregnancy in third trimester 05/17/2015  . AMA (advanced maternal age) multigravida 35+ 05/17/2015    PLAN: Continue present BP management  IOL tomorrow at 34 weeks  Andrewjames Weirauch A, MD 11/17/2015,9:19 AM

## 2015-11-18 ENCOUNTER — Inpatient Hospital Stay (HOSPITAL_COMMUNITY): Payer: 59 | Admitting: Anesthesiology

## 2015-11-18 ENCOUNTER — Encounter (HOSPITAL_COMMUNITY): Payer: Self-pay | Admitting: *Deleted

## 2015-11-18 ENCOUNTER — Other Ambulatory Visit (HOSPITAL_COMMUNITY): Payer: 59

## 2015-11-18 DIAGNOSIS — O1414 Severe pre-eclampsia complicating childbirth: Secondary | ICD-10-CM

## 2015-11-18 DIAGNOSIS — I10 Essential (primary) hypertension: Secondary | ICD-10-CM | POA: Diagnosis present

## 2015-11-18 DIAGNOSIS — Z3A34 34 weeks gestation of pregnancy: Secondary | ICD-10-CM

## 2015-11-18 LAB — CBC
HEMATOCRIT: 31 % — AB (ref 36.0–46.0)
HEMOGLOBIN: 10.5 g/dL — AB (ref 12.0–15.0)
MCH: 31.1 pg (ref 26.0–34.0)
MCHC: 33.9 g/dL (ref 30.0–36.0)
MCV: 91.7 fL (ref 78.0–100.0)
Platelets: 165 10*3/uL (ref 150–400)
RBC: 3.38 MIL/uL — AB (ref 3.87–5.11)
RDW: 14.3 % (ref 11.5–15.5)
WBC: 11.2 10*3/uL — ABNORMAL HIGH (ref 4.0–10.5)

## 2015-11-18 LAB — TYPE AND SCREEN
ABO/RH(D): O POS
ABO/RH(D): O POS
Antibody Screen: NEGATIVE
Antibody Screen: NEGATIVE

## 2015-11-18 LAB — GLUCOSE, CAPILLARY
GLUCOSE-CAPILLARY: 142 mg/dL — AB (ref 65–99)
GLUCOSE-CAPILLARY: 89 mg/dL (ref 65–99)
Glucose-Capillary: 117 mg/dL — ABNORMAL HIGH (ref 65–99)
Glucose-Capillary: 120 mg/dL — ABNORMAL HIGH (ref 65–99)
Glucose-Capillary: 152 mg/dL — ABNORMAL HIGH (ref 65–99)

## 2015-11-18 LAB — MRSA PCR SCREENING: MRSA BY PCR: NEGATIVE

## 2015-11-18 MED ORDER — MAGNESIUM SULFATE 50 % IJ SOLN
2.0000 g/h | INTRAVENOUS | Status: DC
  Administered 2015-11-18 – 2015-11-19 (×2): 2 g/h via INTRAVENOUS
  Filled 2015-11-18 (×2): qty 80

## 2015-11-18 MED ORDER — ACETAMINOPHEN 325 MG PO TABS
650.0000 mg | ORAL_TABLET | ORAL | Status: DC | PRN
Start: 2015-11-18 — End: 2015-11-18

## 2015-11-18 MED ORDER — LACTATED RINGERS IV SOLN: 500.0000 mL | INTRAVENOUS | Status: DC | PRN

## 2015-11-18 MED ORDER — ACETAMINOPHEN 325 MG PO TABS: 650.0000 mg | ORAL_TABLET | ORAL | Status: DC | PRN

## 2015-11-18 MED ORDER — LIDOCAINE HCL (PF) 1 % IJ SOLN
INTRAMUSCULAR | Status: DC | PRN
  Administered 2015-11-18: 2 mL via EPIDURAL
  Administered 2015-11-18: 3 mL via EPIDURAL
  Administered 2015-11-18: 5 mL via EPIDURAL

## 2015-11-18 MED ORDER — LACTATED RINGERS IV SOLN
2.0000 g/h | INTRAVENOUS | Status: DC
  Filled 2015-11-18: qty 80

## 2015-11-18 MED ORDER — COCONUT OIL OIL: 1.0000 "application " | TOPICAL_OIL | Status: DC | PRN

## 2015-11-18 MED ORDER — BENZOCAINE-MENTHOL 20-0.5 % EX AERO: 1.0000 "application " | INHALATION_SPRAY | CUTANEOUS | Status: DC | PRN

## 2015-11-18 MED ORDER — SIMETHICONE 80 MG PO CHEW: 80.0000 mg | CHEWABLE_TABLET | ORAL | Status: DC | PRN

## 2015-11-18 MED ORDER — LABETALOL HCL 5 MG/ML IV SOLN
INTRAVENOUS | Status: AC
  Filled 2015-11-18: qty 4

## 2015-11-18 MED ORDER — LACTATED RINGERS IV SOLN
INTRAVENOUS | Status: DC
  Administered 2015-11-18 – 2015-11-20 (×4): via INTRAVENOUS

## 2015-11-18 MED ORDER — EPHEDRINE 5 MG/ML INJ
10.0000 mg | INTRAVENOUS | Status: DC | PRN
  Filled 2015-11-18: qty 4

## 2015-11-18 MED ORDER — PHENYLEPHRINE 40 MCG/ML (10ML) SYRINGE FOR IV PUSH (FOR BLOOD PRESSURE SUPPORT)
80.0000 ug | PREFILLED_SYRINGE | INTRAVENOUS | Status: DC | PRN
  Filled 2015-11-18: qty 5

## 2015-11-18 MED ORDER — MAGNESIUM SULFATE 50 % IJ SOLN
2.0000 g/h | INTRAVENOUS | Status: DC
  Filled 2015-11-18: qty 80

## 2015-11-18 MED ORDER — HYDRALAZINE HCL 20 MG/ML IJ SOLN: 10.0000 mg | Freq: Once | INTRAMUSCULAR | Status: DC

## 2015-11-18 MED ORDER — ZOLPIDEM TARTRATE 5 MG PO TABS: 5.0000 mg | ORAL_TABLET | Freq: Every evening | ORAL | Status: DC | PRN

## 2015-11-18 MED ORDER — ONDANSETRON HCL 4 MG/2ML IJ SOLN: 4.0000 mg | INTRAMUSCULAR | Status: DC | PRN

## 2015-11-18 MED ORDER — LACTATED RINGERS IV SOLN
INTRAVENOUS | Status: DC
  Administered 2015-11-18: 08:00:00 via INTRAVENOUS

## 2015-11-18 MED ORDER — OXYCODONE HCL 5 MG PO TABS
5.0000 mg | ORAL_TABLET | ORAL | Status: DC | PRN
  Administered 2015-11-19 – 2015-11-20 (×3): 5 mg via ORAL
  Filled 2015-11-18 (×3): qty 1

## 2015-11-18 MED ORDER — PRENATAL MULTIVITAMIN CH: 1.0000 | ORAL_TABLET | Freq: Every day | ORAL | Status: DC

## 2015-11-18 MED ORDER — FENTANYL 2.5 MCG/ML BUPIVACAINE 1/10 % EPIDURAL INFUSION (WH - ANES)
14.0000 mL/h | INTRAMUSCULAR | Status: DC | PRN
  Administered 2015-11-18: 14 mL/h via EPIDURAL

## 2015-11-18 MED ORDER — DIPHENHYDRAMINE HCL 50 MG/ML IJ SOLN: 12.5000 mg | INTRAMUSCULAR | Status: DC | PRN

## 2015-11-18 MED ORDER — TETANUS-DIPHTH-ACELL PERTUSSIS 5-2.5-18.5 LF-MCG/0.5 IM SUSP: 0.5000 mL | Freq: Once | INTRAMUSCULAR | Status: DC

## 2015-11-18 MED ORDER — DEXTROSE 5 % IV SOLN
2.5000 10*6.[IU] | INTRAVENOUS | Status: DC
  Administered 2015-11-18: 2.5 10*6.[IU] via INTRAVENOUS
  Filled 2015-11-18 (×4): qty 2.5

## 2015-11-18 MED ORDER — DEXTROSE 5 % IV SOLN
5.0000 10*6.[IU] | Freq: Once | INTRAVENOUS | Status: AC
  Administered 2015-11-18: 5 10*6.[IU] via INTRAVENOUS
  Filled 2015-11-18: qty 5

## 2015-11-18 MED ORDER — LACTATED RINGERS IV SOLN: 500.0000 mL | Freq: Once | INTRAVENOUS | Status: DC

## 2015-11-18 MED ORDER — MAGNESIUM SULFATE BOLUS VIA INFUSION
4.0000 g | Freq: Once | INTRAVENOUS | Status: AC
  Administered 2015-11-18: 4 g via INTRAVENOUS
  Filled 2015-11-18: qty 500

## 2015-11-18 MED ORDER — PHENYLEPHRINE 40 MCG/ML (10ML) SYRINGE FOR IV PUSH (FOR BLOOD PRESSURE SUPPORT)
PREFILLED_SYRINGE | INTRAVENOUS | Status: AC
  Filled 2015-11-18: qty 10

## 2015-11-18 MED ORDER — LIDOCAINE HCL (PF) 1 % IJ SOLN
30.0000 mL | INTRAMUSCULAR | Status: DC | PRN
  Filled 2015-11-18: qty 30

## 2015-11-18 MED ORDER — SOD CITRATE-CITRIC ACID 500-334 MG/5ML PO SOLN
30.0000 mL | ORAL | Status: DC | PRN
  Filled 2015-11-18: qty 15

## 2015-11-18 MED ORDER — HYDRALAZINE HCL 20 MG/ML IJ SOLN
10.0000 mg | Freq: Once | INTRAMUSCULAR | Status: AC | PRN
  Administered 2015-11-18: 10 mg via INTRAVENOUS
  Filled 2015-11-18: qty 1

## 2015-11-18 MED ORDER — OXYCODONE-ACETAMINOPHEN 5-325 MG PO TABS: 2.0000 | ORAL_TABLET | ORAL | Status: DC | PRN

## 2015-11-18 MED ORDER — AMLODIPINE BESYLATE 10 MG PO TABS
10.0000 mg | ORAL_TABLET | Freq: Every day | ORAL | Status: DC
Start: 2015-11-18 — End: 2015-11-20
  Administered 2015-11-18 – 2015-11-20 (×3): 10 mg via ORAL
  Filled 2015-11-18 (×4): qty 1

## 2015-11-18 MED ORDER — FENTANYL 2.5 MCG/ML BUPIVACAINE 1/10 % EPIDURAL INFUSION (WH - ANES)
INTRAMUSCULAR | Status: AC
  Filled 2015-11-18: qty 125

## 2015-11-18 MED ORDER — LABETALOL HCL 5 MG/ML IV SOLN
20.0000 mg | INTRAVENOUS | Status: DC | PRN
  Administered 2015-11-18: 20 mg via INTRAVENOUS

## 2015-11-18 MED ORDER — MISOPROSTOL 200 MCG PO TABS: 800.0000 ug | ORAL_TABLET | Freq: Once | ORAL | Status: DC

## 2015-11-18 MED ORDER — DIBUCAINE 1 % RE OINT: 1.0000 "application " | TOPICAL_OINTMENT | RECTAL | Status: DC | PRN

## 2015-11-18 MED ORDER — OXYCODONE-ACETAMINOPHEN 5-325 MG PO TABS: 1.0000 | ORAL_TABLET | ORAL | Status: DC | PRN

## 2015-11-18 MED ORDER — OXYTOCIN 40 UNITS IN LACTATED RINGERS INFUSION - SIMPLE MED
2.5000 [IU]/h | INTRAVENOUS | Status: DC
  Filled 2015-11-18: qty 1000

## 2015-11-18 MED ORDER — WITCH HAZEL-GLYCERIN EX PADS: 1.0000 "application " | MEDICATED_PAD | CUTANEOUS | Status: DC | PRN

## 2015-11-18 MED ORDER — OXYTOCIN BOLUS FROM INFUSION
500.0000 mL | Freq: Once | INTRAVENOUS | Status: AC
  Administered 2015-11-18: 500 mL via INTRAVENOUS

## 2015-11-18 MED ORDER — ONDANSETRON HCL 4 MG PO TABS: 4.0000 mg | ORAL_TABLET | ORAL | Status: DC | PRN

## 2015-11-18 MED ORDER — FLEET ENEMA 7-19 GM/118ML RE ENEM: 1.0000 | ENEMA | RECTAL | Status: DC | PRN

## 2015-11-18 MED ORDER — SENNOSIDES-DOCUSATE SODIUM 8.6-50 MG PO TABS
2.0000 | ORAL_TABLET | ORAL | Status: DC
  Administered 2015-11-18 – 2015-11-19 (×2): 2 via ORAL
  Filled 2015-11-18 (×2): qty 2

## 2015-11-18 MED ORDER — ONDANSETRON HCL 4 MG/2ML IJ SOLN: 4.0000 mg | Freq: Four times a day (QID) | INTRAMUSCULAR | Status: DC | PRN

## 2015-11-18 MED ORDER — DIPHENHYDRAMINE HCL 25 MG PO CAPS
25.0000 mg | ORAL_CAPSULE | Freq: Four times a day (QID) | ORAL | Status: DC | PRN
  Administered 2015-11-19: 25 mg via ORAL
  Filled 2015-11-18: qty 1

## 2015-11-18 MED ORDER — MISOPROSTOL 25 MCG QUARTER TABLET
25.0000 ug | ORAL_TABLET | ORAL | Status: DC
  Administered 2015-11-18: 25 ug via VAGINAL
  Filled 2015-11-18: qty 1
  Filled 2015-11-18: qty 0.25
  Filled 2015-11-18 (×4): qty 1

## 2015-11-18 NOTE — Anesthesia Postprocedure Evaluation (Signed)
Anesthesia Post Note  Patient: Kara Brooks  Procedure(s) Performed: * No procedures listed *  Patient location during evaluation: Mother Baby Anesthesia Type: Epidural Level of consciousness: awake and alert Pain management: satisfactory to patient Vital Signs Assessment: post-procedure vital signs reviewed and stable Respiratory status: respiratory function stable Cardiovascular status: stable Postop Assessment: no headache, no backache, epidural receding, patient able to bend at knees, no signs of nausea or vomiting and adequate PO intake Anesthetic complications: no     Last Vitals:  Vitals:   11/18/15 1745 11/18/15 1950  BP: 140/80 139/81  Pulse: 80   Resp: 18   Temp: 36.9 C     Last Pain:  Vitals:   11/18/15 1845  TempSrc:   PainSc: 0-No pain   Pain Goal: Patients Stated Pain Goal: 3 (11/15/15 2005)               Takiah Maiden

## 2015-11-18 NOTE — Anesthesia Procedure Notes (Signed)
Epidural Patient location during procedure: OB  Staffing Anesthesiologist: Cecile HearingURK, STEPHEN EDWARD Performed: anesthesiologist   Preanesthetic Checklist Completed: patient identified, pre-op evaluation, timeout performed, IV checked, risks and benefits discussed and monitors and equipment checked  Epidural Patient position: sitting Prep: DuraPrep Patient monitoring: blood pressure and continuous pulse ox Approach: midline Location: L3-L4 Injection technique: LOR air  Needle:  Needle type: Tuohy  Needle gauge: 17 G Needle length: 9 cm Needle insertion depth: 6 cm Catheter size: 19 Gauge Catheter at skin depth: 10 cm Test dose: negative and Other (1% Lidocaine)  Additional Notes Patient identified.  Risk benefits discussed including failed block, incomplete pain control, headache, nerve damage, paralysis, blood pressure changes, nausea, vomiting, reactions to medication both toxic or allergic, and postpartum back pain.  Patient expressed understanding and wished to proceed.  All questions were answered.  Sterile technique used throughout procedure and epidural site dressed with sterile barrier dressing. No paresthesia or other complications noted. The patient did not experience any signs of intravascular injection such as tinnitus or metallic taste in mouth nor signs of intrathecal spread such as rapid motor block. Please see nursing notes for vital signs. Reason for block:procedure for pain

## 2015-11-18 NOTE — Progress Notes (Signed)
Kara Brooks is a 35 y.o. G3P1102 at 3628w0d by ultrasound admitted for superimposed pre-eclampsia with severe features.  Subjective: Pt. With some decels following cytotec placement.  Objective: BP (!) 153/84   Pulse 81   Temp 98.6 F (37 C) (Oral)   Resp 17   Ht 5\' 6"  (1.676 m)   Wt 186 lb 9.6 oz (84.6 kg)   LMP 03/03/2015 (Approximate)   SpO2 100%   BMI 30.12 kg/m  I/O last 3 completed shifts: In: 784 [P.O.:780; I.V.:4] Out: 2200 [Urine:2200] Total I/O In: 1026.3 [P.O.:720; I.V.:306.3] Out: -   FHT:  FHR: 140 bpm, variability: minimal ,  accelerations:  Abscent,  decelerations:  Present variable to late UC:   regular, every no minutes SVE: 2-3/60/-3  Labs: Lab Results  Component Value Date   WBC 11.2 (H) 11/18/2015   HGB 10.5 (L) 11/18/2015   HCT 31.0 (L) 11/18/2015   MCV 91.7 11/18/2015   PLT 165 11/18/2015    Assessment / Plan: Induction of labor due to preeclampsia,  progressing well on pitocin  Labor: Brooks/p foley bulb placement Preeclampsia:  on magnesium sulfate Fetal Wellbeing:  Category II Pain Control:  Labor support without medications I/D:  n/a Anticipated MOD:  NSVD  Kara Brooks 11/18/2015, 12:37 PM

## 2015-11-18 NOTE — Anesthesia Pain Management Evaluation Note (Signed)
  CRNA Pain Management Visit Note  Patient: Kara Brooks, 35 y.o., female  "Hello [facility] am [facility]. We have an anesthesia team available at all times to provide care throughout the hospital, including epidural management and anesthesia for C-section. I don't know your plan for the delivery whether it a natural birth, water birth, IV sedation, nitrous supplementation, doula or epidural, but we want to meet your pain goals."   1.Was your pain managed to your expectations on prior hospitalizations?   Yes   2.What is your expectation for pain management during this hospitalization?     Epidural  3.How can we help you reach that goal? epidural  Record the patient's initial score and the patient's pain goal.   Pain: 0  Pain Goal: 4 [facility] wants you to be able to say your pain was always managed very well.  [name] 11/18/2015

## 2015-11-18 NOTE — Progress Notes (Signed)
Ivy LynnSamantha E Nieland is a 35 y.o. G3P1102 at 2866w0d by ultrasound admitted for induction of labor due to superimposed pre-eclampsia.  Subjective: Foley out  Objective: BP (!) 153/84   Pulse 81   Temp 98.6 F (37 C) (Oral)   Resp 17   Ht 5\' 6"  (1.676 m)   Wt 186 lb 9.6 oz (84.6 kg)   LMP 03/03/2015 (Approximate)   SpO2 100%   BMI 30.12 kg/m  I/O last 3 completed shifts: In: 784 [P.O.:780; I.V.:4] Out: 2200 [Urine:2200] Total I/O In: 1026.3 [P.O.:720; I.V.:306.3] Out: -   FHT:  FHR: 140 bpm, variability: minimal ,  accelerations:  Abscent,  decelerations:  Present variable to late UC:   irregular, every 5 minutes SVE:  4-5/50/-2 Labs: Lab Results  Component Value Date   WBC 11.2 (H) 11/18/2015   HGB 10.5 (L) 11/18/2015   HCT 31.0 (L) 11/18/2015   MCV 91.7 11/18/2015   PLT 165 11/18/2015    Assessment / Plan: Induction of labor due to preeclampsia,  progressing well on pitocin  Labor: Progressing normally Preeclampsia:  on magnesium sulfate Fetal Wellbeing:  Category II Pain Control:  Labor support without medications I/D:  n/a Anticipated MOD:  NSVD  Sherry Blackard S 11/18/2015, 1:28 PM

## 2015-11-18 NOTE — Anesthesia Preprocedure Evaluation (Signed)
Anesthesia Evaluation  Patient identified by MRN, date of birth, ID band Patient awake    Reviewed: Allergy & Precautions, NPO status , Patient's Chart, lab work & pertinent test results, reviewed documented beta blocker date and time   History of Anesthesia Complications Negative for: history of anesthetic complications  Airway Mallampati: III  TM Distance: >3 FB Neck ROM: Full    Dental  (+) Teeth Intact, Dental Advisory Given   Pulmonary Current Smoker,    Pulmonary exam normal breath sounds clear to auscultation       Cardiovascular hypertension (gestational HTN/Pre-eclampsia), Pt. on home beta blockers and Pt. on medications Normal cardiovascular exam Rhythm:Regular Rate:Normal     Neuro/Psych negative neurological ROS  negative psych ROS   GI/Hepatic negative GI ROS, Neg liver ROS,   Endo/Other  Obesity   Renal/GU negative Renal ROS     Musculoskeletal negative musculoskeletal ROS (+)   Abdominal   Peds  Hematology  (+) Blood dyscrasia, anemia , Plt 165k   Anesthesia Other Findings Day of surgery medications reviewed with the patient.  Reproductive/Obstetrics (+) Pregnancy Pre-eclampsia                            Anesthesia Physical Anesthesia Plan  ASA: III  Anesthesia Plan: Epidural   Post-op Pain Management:    Induction:   Airway Management Planned:   Additional Equipment:   Intra-op Plan:   Post-operative Plan:   Informed Consent: I have reviewed the patients History and Physical, chart, labs and discussed the procedure including the risks, benefits and alternatives for the proposed anesthesia with the patient or authorized representative who has indicated his/her understanding and acceptance.   Dental advisory given  Plan Discussed with:   Anesthesia Plan Comments: (Patient identified. Risks/Benefits/Options discussed with patient including but not limited  to bleeding, infection, nerve damage, paralysis, failed block, incomplete pain control, headache, blood pressure changes, nausea, vomiting, reactions to medication both or allergic, itching and postpartum back pain. Confirmed with bedside nurse the patient's most recent platelet count. Confirmed with patient that they are not currently taking any anticoagulation, have any bleeding history or any family history of bleeding disorders. Patient expressed understanding and wished to proceed. All questions were answered. )        Anesthesia Quick Evaluation

## 2015-11-18 NOTE — Progress Notes (Signed)
Dr. Shawnie PonsPratt notified in regards to patient's behavior. Pt being acting agitated towards this RN in regards to her need to have Mag off and her need to go out and smoke. Pt states, "We are not going to like her very much" if Mag is not d/c'd. Pt was also overheard telling visitors that she will "turn the Mag off herself". Dr. Shawnie PonsPratt states that she will see the patient. Carmelina DaneERRI L Irasema Chalk, RN

## 2015-11-19 ENCOUNTER — Encounter (HOSPITAL_COMMUNITY): Payer: Self-pay | Admitting: Certified Registered Nurse Anesthetist

## 2015-11-19 ENCOUNTER — Encounter (HOSPITAL_COMMUNITY)
Admission: AD | Disposition: A | Discharge status: Home / Self Care | Payer: Self-pay | Source: Ambulatory Visit | Attending: Family Medicine

## 2015-11-19 LAB — GLUCOSE, CAPILLARY
GLUCOSE-CAPILLARY: 80 mg/dL (ref 65–99)
GLUCOSE-CAPILLARY: 161 mg/dL — AB (ref 65–99)

## 2015-11-19 LAB — RPR: RPR Ser Ql: NONREACTIVE

## 2015-11-19 SURGERY — LIGATION, FALLOPIAN TUBE, POSTPARTUM
Anesthesia: Choice | Laterality: Bilateral

## 2015-11-19 MED ORDER — BUPIVACAINE HCL (PF) 0.5 % IJ SOLN
INTRAMUSCULAR | Status: AC
  Filled 2015-11-19: qty 30

## 2015-11-19 NOTE — Progress Notes (Signed)
Post Partum Day 1 Subjective: no complaints, voiding and tolerating PO  Objective: Blood pressure (!) 141/76, pulse 86, temperature 98.4 F (36.9 C), resp. rate 18, height 5\' 6"  (1.676 m), weight 186 lb 9.6 oz (84.6 kg), last menstrual period 03/03/2015, SpO2 99 %, unknown if currently breastfeeding.  Physical Exam:  General: alert, cooperative and appears stated age 19Lochia: appropriate Uterine Fundus: firm DVT Evaluation: No evidence of DVT seen on physical exam.   Recent Labs  11/18/15 1054  HGB 10.5*  HCT 31.0*   CBG (last 3)   Recent Labs  11/18/15 1044 11/18/15 1433 11/18/15 2158  GLUCAP 142* 89 152*     Assessment/Plan: Plan for discharge tomorrow and Contraception BTL Bottle +/- pump On Magnesium x 24 hours, off after 3 pm today Norvasc 10 daily and BP ok Watch BS For BTL today Risks include but are not limited to bleeding, infection, injury to surrounding structures, including bowel, bladder and ureters, blood clots, and death.  Likelihood of success is high.   LOS: 9 days   PRATT,TANYA S 11/19/2015, 8:14 AM

## 2015-11-19 NOTE — Lactation Note (Signed)
This note was copied from a baby's chart. Lactation Consultation Note  Patient Name: Kara Brooks BJYNW'GToday's Date: 11/19/2015 Reason for consult: Initial assessment;NICU baby  Baby 4123 hours old. Mom has DEBP in room, and mom reports that she has pumped once. Mom states that she only pumped a drop or two, but she was told that is normal and to keep pumping every 2-3 hours. Mom reports that she will not be putting the baby to breast because she is not comfortable with nursing. Mom states that she knows that EBM is best for the baby, but she really does not know if she will continue to pump. Mom became teary. Discussed with mom that she is the only one to decide whether she wants to pump and provide EBM while baby in NICU or not. Mom given colostrum containers and NICU booklet with review. Mom also given [facility] brochure, and is aware of OP/BFSG and LC phone line assistance after D/C. Enc mom to hand express after pumping and to take whatever EBM she gets to NICU. Mom has just returned from visiting baby in NICU, so discussed the benefits of pumping and hand expressing after the visit. Enc mom to call for assistance as needed. Discussed assessment and interventions with patient's bedside nurse, Luanna ColeBriana, RN.  Maternal Data Has patient been taught Hand Expression?: Yes (Per mom.) Does the patient have breastfeeding experience prior to this delivery?: No  Feeding Feeding Type: Formula Nipple Type: Slow - flow Length of feed: 5 min  LATCH Score/Interventions                      Lactation Tools Discussed/Used Pump Review: Setup, frequency, and cleaning;Milk Storage Initiated by:: Bedside RN. Date initiated:: 11/18/15   Consult Status Consult Status: Follow-up Date: 11/20/15 Follow-up type: In-patient    Kara Brooks 11/19/2015, 2:58 PM

## 2015-11-19 NOTE — Progress Notes (Signed)
Pt reports that she has hx of post partum depression and has felt anxious and overwhelmed all day. States she has not been able to sleep and cannot relax. Wants to start back on her anti-depression med. zoloft and her med for sleep seroquel. Educated pt and sister on relaxation techniques, and outpatient support groups that are available through NICU. Will place a social work consult. Continue to monitor.

## 2015-11-20 ENCOUNTER — Inpatient Hospital Stay (HOSPITAL_COMMUNITY): Payer: 59 | Admitting: Certified Registered Nurse Anesthetist

## 2015-11-20 ENCOUNTER — Encounter (HOSPITAL_COMMUNITY)
Admission: AD | Disposition: A | Discharge status: Home / Self Care | Payer: Self-pay | Source: Ambulatory Visit | Attending: Family Medicine

## 2015-11-20 DIAGNOSIS — Z9851 Tubal ligation status: Secondary | ICD-10-CM

## 2015-11-20 DIAGNOSIS — Z302 Encounter for sterilization: Secondary | ICD-10-CM

## 2015-11-20 HISTORY — PX: TUBAL LIGATION: SHX77

## 2015-11-20 LAB — GLUCOSE, CAPILLARY: Glucose-Capillary: 68 mg/dL (ref 65–99)

## 2015-11-20 SURGERY — LIGATION, FALLOPIAN TUBE, POSTPARTUM
Anesthesia: Epidural | Site: Abdomen | Laterality: Bilateral

## 2015-11-20 MED ORDER — FENTANYL CITRATE (PF) 100 MCG/2ML IJ SOLN
INTRAMUSCULAR | Status: AC
  Filled 2015-11-20: qty 2

## 2015-11-20 MED ORDER — BUPIVACAINE HCL 0.5 % IJ SOLN
INTRAMUSCULAR | Status: DC | PRN
  Administered 2015-11-20: 30 mL

## 2015-11-20 MED ORDER — ONDANSETRON HCL 4 MG/2ML IJ SOLN
INTRAMUSCULAR | Status: AC
  Filled 2015-11-20: qty 2

## 2015-11-20 MED ORDER — LIDOCAINE HCL (CARDIAC) 20 MG/ML IV SOLN
INTRAVENOUS | Status: DC | PRN
  Administered 2015-11-20: 40 mg via INTRATRACHEAL

## 2015-11-20 MED ORDER — FENTANYL CITRATE (PF) 100 MCG/2ML IJ SOLN
25.0000 ug | INTRAMUSCULAR | Status: DC | PRN
  Administered 2015-11-20: 50 ug via INTRAVENOUS

## 2015-11-20 MED ORDER — PROPOFOL 10 MG/ML IV BOLUS
INTRAVENOUS | Status: DC | PRN
  Administered 2015-11-20: 40 mg via INTRAVENOUS

## 2015-11-20 MED ORDER — MIDAZOLAM HCL 2 MG/2ML IJ SOLN
INTRAMUSCULAR | Status: AC
  Filled 2015-11-20: qty 2

## 2015-11-20 MED ORDER — SERTRALINE HCL 50 MG PO TABS
50.0000 mg | ORAL_TABLET | Freq: Every day | ORAL | Status: DC
Start: 2015-11-20 — End: 2015-11-20
  Administered 2015-11-20: 50 mg via ORAL
  Filled 2015-11-20 (×2): qty 1

## 2015-11-20 MED ORDER — DEXAMETHASONE SODIUM PHOSPHATE 4 MG/ML IJ SOLN
INTRAMUSCULAR | Status: AC
  Filled 2015-11-20: qty 1

## 2015-11-20 MED ORDER — BUPIVACAINE IN DEXTROSE 0.75-8.25 % IT SOLN
INTRATHECAL | Status: DC | PRN
  Administered 2015-11-20: 1.6 mL via INTRATHECAL

## 2015-11-20 MED ORDER — MIDAZOLAM HCL 2 MG/2ML IJ SOLN
INTRAMUSCULAR | Status: DC | PRN
  Administered 2015-11-20: 2 mg via INTRAVENOUS
  Administered 2015-11-20: 1 mg via INTRAVENOUS

## 2015-11-20 MED ORDER — LIDOCAINE HCL (CARDIAC) 20 MG/ML IV SOLN
INTRAVENOUS | Status: AC
Start: 2015-11-20 — End: 2015-11-20
  Filled 2015-11-20: qty 5

## 2015-11-20 MED ORDER — GLYCOPYRROLATE 0.2 MG/ML IJ SOLN
INTRAMUSCULAR | Status: DC | PRN
  Administered 2015-11-20: 0.1 mg via INTRAVENOUS

## 2015-11-20 MED ORDER — GLYCOPYRROLATE 0.2 MG/ML IJ SOLN
INTRAMUSCULAR | Status: AC
  Filled 2015-11-20: qty 1

## 2015-11-20 MED ORDER — SERTRALINE HCL 50 MG PO TABS: 50.0000 mg | ORAL_TABLET | Freq: Every day | ORAL | 2 refills | Status: DC

## 2015-11-20 MED ORDER — POLYETHYLENE GLYCOL 3350 17 G PO PACK
34.0000 g | PACK | Freq: Two times a day (BID) | ORAL | Status: DC
  Filled 2015-11-20 (×2): qty 2

## 2015-11-20 MED ORDER — KETOROLAC TROMETHAMINE 30 MG/ML IJ SOLN: 30.0000 mg | Freq: Once | INTRAMUSCULAR | Status: DC

## 2015-11-20 MED ORDER — PROMETHAZINE HCL 25 MG/ML IJ SOLN: 6.2500 mg | INTRAMUSCULAR | Status: DC | PRN

## 2015-11-20 MED ORDER — PROPOFOL 10 MG/ML IV BOLUS
INTRAVENOUS | Status: AC
  Filled 2015-11-20: qty 20

## 2015-11-20 MED ORDER — FENTANYL CITRATE (PF) 100 MCG/2ML IJ SOLN
INTRAMUSCULAR | Status: DC | PRN
  Administered 2015-11-20: 100 ug via EPIDURAL

## 2015-11-20 MED ORDER — OXYCODONE-ACETAMINOPHEN 5-325 MG PO TABS: 1.0000 | ORAL_TABLET | Freq: Four times a day (QID) | ORAL | 0 refills | Status: DC | PRN

## 2015-11-20 MED ORDER — FENTANYL CITRATE (PF) 100 MCG/2ML IJ SOLN
INTRAMUSCULAR | Status: AC
  Administered 2015-11-20: 50 ug via INTRAVENOUS
  Filled 2015-11-20: qty 2

## 2015-11-20 MED ORDER — LIDOCAINE-EPINEPHRINE (PF) 2 %-1:200000 IJ SOLN
INTRAMUSCULAR | Status: AC
  Filled 2015-11-20: qty 20

## 2015-11-20 MED ORDER — ONDANSETRON HCL 4 MG/2ML IJ SOLN
INTRAMUSCULAR | Status: DC | PRN
  Administered 2015-11-20: 4 mg via INTRAVENOUS

## 2015-11-20 MED ORDER — DEXAMETHASONE SODIUM PHOSPHATE 4 MG/ML IJ SOLN
INTRAMUSCULAR | Status: DC | PRN
  Administered 2015-11-20: 4 mg via INTRAVENOUS

## 2015-11-20 MED ORDER — SODIUM BICARBONATE 8.4 % IV SOLN
INTRAVENOUS | Status: DC | PRN
  Administered 2015-11-20 (×4): 5 mL via EPIDURAL

## 2015-11-20 MED ORDER — MEPERIDINE HCL 25 MG/ML IJ SOLN: 6.2500 mg | INTRAMUSCULAR | Status: DC | PRN

## 2015-11-20 SURGICAL SUPPLY — 22 items
BLADE SURG CLIPPER 3M 9600 (MISCELLANEOUS) ×2 IMPLANT
CATH ROBINSON RED A/P 16FR (CATHETERS) ×1 IMPLANT
CLIP FILSHIE TUBAL LIGA STRL (Clip) ×4 IMPLANT
CLOTH BEACON ORANGE TIMEOUT ST (SAFETY) ×3 IMPLANT
DRSG OPSITE POSTOP 3X4 (GAUZE/BANDAGES/DRESSINGS) ×3 IMPLANT
DURAPREP 26ML APPLICATOR (WOUND CARE) ×3 IMPLANT
GLOVE BIOGEL PI IND STRL 7.0 (GLOVE) ×3 IMPLANT
GLOVE BIOGEL PI INDICATOR 7.0 (GLOVE) ×6
GLOVE ECLIPSE 7.0 STRL STRAW (GLOVE) ×3 IMPLANT
GOWN STRL REUS W/TWL LRG LVL3 (GOWN DISPOSABLE) ×6 IMPLANT
GOWN STRL REUS W/TWL XL LVL3 (GOWN DISPOSABLE) ×3 IMPLANT
NEEDLE HYPO 22GX1.5 SAFETY (NEEDLE) ×2 IMPLANT
NS IRRIG 1000ML POUR BTL (IV SOLUTION) ×3 IMPLANT
PACK ABDOMINAL MINOR (CUSTOM PROCEDURE TRAY) ×3 IMPLANT
PROTECTOR NERVE ULNAR (MISCELLANEOUS) ×6 IMPLANT
SUT VIC AB 0 CT1 27 (SUTURE) ×3
SUT VIC AB 0 CT1 27XBRD ANBCTR (SUTURE) ×1 IMPLANT
SUT VIC AB 4-0 PS2 27 (SUTURE) ×3 IMPLANT
SYR CONTROL 10ML LL (SYRINGE) ×2 IMPLANT
TOWEL OR 17X24 6PK STRL BLUE (TOWEL DISPOSABLE) ×6 IMPLANT
TRAY FOLEY CATH SILVER 14FR (SET/KITS/TRAYS/PACK) ×2 IMPLANT
WATER STERILE IRR 1000ML POUR (IV SOLUTION) ×1 IMPLANT

## 2015-11-20 NOTE — Anesthesia Preprocedure Evaluation (Signed)
Anesthesia Evaluation  Patient identified by MRN, date of birth, ID band Patient awake    Reviewed: Allergy & Precautions, NPO status , Patient's Chart, lab work & pertinent test results, reviewed documented beta blocker date and time   History of Anesthesia Complications Negative for: history of anesthetic complications  Airway Mallampati: III  TM Distance: >3 FB Neck ROM: Full    Dental no notable dental hx. (+) Teeth Intact, Dental Advisory Given   Pulmonary Current Smoker,    Pulmonary exam normal breath sounds clear to auscultation       Cardiovascular hypertension (gestational HTN/Pre-eclampsia), Pt. on home beta blockers and Pt. on medications Normal cardiovascular exam Rhythm:Regular Rate:Normal     Neuro/Psych negative neurological ROS  negative psych ROS   GI/Hepatic negative GI ROS, Neg liver ROS,   Endo/Other  Obesity   Renal/GU negative Renal ROS     Musculoskeletal negative musculoskeletal ROS (+)   Abdominal Normal abdominal exam  (+)   Peds  Hematology  (+) Blood dyscrasia, anemia , Plt 165k   Anesthesia Other Findings Day of surgery medications reviewed with the patient.  Reproductive/Obstetrics (+) Pregnancy Pre-eclampsia                             Anesthesia Physical  Anesthesia Plan  ASA: III  Anesthesia Plan: Epidural   Post-op Pain Management:    Induction:   Airway Management Planned:   Additional Equipment:   Intra-op Plan:   Post-operative Plan:   Informed Consent: I have reviewed the patients History and Physical, chart, labs and discussed the procedure including the risks, benefits and alternatives for the proposed anesthesia with the patient or authorized representative who has indicated his/her understanding and acceptance.   Dental advisory given  Plan Discussed with:   Anesthesia Plan Comments: (Patient identified.  Risks/Benefits/Options discussed with patient including but not limited to bleeding, infection, nerve damage, paralysis, failed block, incomplete pain control, headache, blood pressure changes, nausea, vomiting, reactions to medication both or allergic, itching and postpartum back pain. Confirmed with bedside nurse the patient's most recent platelet count. Confirmed with patient that they are not currently taking any anticoagulation, have any bleeding history or any family history of bleeding disorders. Patient expressed understanding and wished to proceed. All questions were answered.  Pt now presents for PPTL.)        Anesthesia Quick Evaluation

## 2015-11-20 NOTE — Discharge Instructions (Signed)
Postpartum Tubal Ligation, Care After Refer to this sheet in the next few weeks. These instructions provide you with information about caring for yourself after your procedure. Your health care provider may also give you more specific instructions. Your treatment has been planned according to current medical practices, but problems sometimes occur. Call your health care provider if you have any problems or questions after your procedure. WHAT TO EXPECT AFTER THE PROCEDURE After your procedure, it is common to have:  Sore throat.  Soreness at the incision site.  Mild cramping.  Tiredness.  Mild nausea or vomiting. HOME CARE INSTRUCTIONS  Rest for the remainder of the day.  Take medicines only as directed by your health care provider. These include over-the-counter medicines and prescription medicines. Do not take aspirin, which can cause bleeding.  Over the next few days, gradually return to your normal activities and your normal diet.  Avoid sexual intercourse for 2 weeks or as directed by your health care provider.  Do not drive or operate heavy machinery while taking pain medicine.  Do not lift anything that is heavier than 5 lb (2.3 kg) for 2 weeks or as directed by your health care provider.  Do not take baths. Take showers only. Ask your health care provider when you can start taking baths.  Take your temperature twice each day and write it down.  Try to have help for the first 7-10 days for your household needs.  There are many different ways to close and cover an incision, including stitches (sutures), skin glue, and adhesive strips. Follow instructions from your health care provider about:  Incision care.  Bandage (dressing) changes and removal.  Incision closure removal.  Check your incision area every day for signs of infection. Watch for:  Redness, swelling, or pain.  Fluid, blood, or pus.  Keep all follow-up visits as directed by your health care  provider. SEEK MEDICAL CARE IF:  You have redness, swelling, or increasing pain in your incision area.  You have fluid or pus coming from your incision for longer than 1 day.  You notice a bad smell coming from your incision or your dressing.  The edges of your incision break open after the sutures have been removed.  Your pain does not decrease after 2-3 days.  You have a rash.  You repeatedly become dizzy or light-headed.  You have a reaction to your medicine.  Your pain medicine is not helping.  You are constipated. SEEK IMMEDIATE MEDICAL CARE IF:   You have a fever.  You faint.  You have increasing pain in your abdomen.  You have bleeding or drainage from your suture sites or your vagina after surgery.  You have shortness of breath or have difficulty breathing.  You have chest pain or leg pain.  You have ongoing nausea, vomiting, or diarrhea.   This information is not intended to replace advice given to you by your health care provider. Make sure you discuss any questions you have with your health care provider.   Document Released: 09/25/2011 Document Revised: 08/10/2014 Document Reviewed: 09/25/2011 Elsevier Interactive Patient Education 2016 Elsevier Inc.   Vaginal Delivery, Care After Refer to this sheet in the next few weeks. These discharge instructions provide you with information on caring for yourself after delivery. Your health care provider may also give you specific instructions. Your treatment has been planned according to the most current medical practices available, but problems sometimes occur. Call your health care provider if you have any problems  or questions after you go home. HOME CARE INSTRUCTIONS  Take over-the-counter or prescription medicines only as directed by your health care provider or pharmacist.  Do not drink alcohol, especially if you are breastfeeding or taking medicine to relieve pain.  Do not chew or smoke tobacco.  Do not  use illegal drugs.  Continue to use good perineal care. Good perineal care includes:  Wiping your perineum from front to back.  Keeping your perineum clean.  Do not use tampons or douche until your health care provider says it is okay.  Shower, wash your hair, and take tub baths as directed by your health care provider.  Wear a well-fitting bra that provides breast support.  Eat healthy foods.  Drink enough fluids to keep your urine clear or pale yellow.  Eat high-fiber foods such as whole grain cereals and breads, brown rice, beans, and fresh fruits and vegetables every day. These foods may help prevent or relieve constipation.  Follow your health care provider's recommendations regarding resumption of activities such as climbing stairs, driving, lifting, exercising, or traveling.  Talk to your health care provider about resuming sexual activities. Resumption of sexual activities is dependent upon your risk of infection, your rate of healing, and your comfort and desire to resume sexual activity.  Try to have someone help you with your household activities and your newborn for at least a few days after you leave the hospital.  Rest as much as possible. Try to rest or take a nap when your newborn is sleeping.  Increase your activities gradually.  Keep all of your scheduled postpartum appointments. It is very important to keep your scheduled follow-up appointments. At these appointments, your health care provider will be checking to make sure that you are healing physically and emotionally. SEEK MEDICAL CARE IF:   You are passing large clots from your vagina. Save any clots to show your health care provider.  You have a foul smelling discharge from your vagina.  You have trouble urinating.  You are urinating frequently.  You have pain when you urinate.  You have a change in your bowel movements.  You have increasing redness, pain, or swelling near your vaginal incision  (episiotomy) or vaginal tear.  You have pus draining from your episiotomy or vaginal tear.  Your episiotomy or vaginal tear is separating.  You have painful, hard, or reddened breasts.  You have a severe headache.  You have blurred vision or see spots.  You feel sad or depressed.  You have thoughts of hurting yourself or your newborn.  You have questions about your care, the care of your newborn, or medicines.  You are dizzy or light-headed.  You have a rash.  You have nausea or vomiting.  You were breastfeeding and have not had a menstrual period within 12 weeks after you stopped breastfeeding.  You are not breastfeeding and have not had a menstrual period by the 12th week after delivery.  You have a fever. SEEK IMMEDIATE MEDICAL CARE IF:   You have persistent pain.  You have chest pain.  You have shortness of breath.  You faint.  You have leg pain.  You have stomach pain.  Your vaginal bleeding saturates two or more sanitary pads in 1 hour.   This information is not intended to replace advice given to you by your health care provider. Make sure you discuss any questions you have with your health care provider.   Document Released: 03/23/2000 Document Revised: 12/15/2014 Document Reviewed:  Interactive Patient Education ©2016 Elsevier Inc. ° °

## 2015-11-20 NOTE — Op Note (Signed)
Kara LynnSamantha E Brooks 11/10/2015 - 11/20/2015  PREOPERATIVE DIAGNOSES: Multiparity, undesired fertility  POSTOPERATIVE DIAGNOSES: Multiparity, undesired fertility  PROCEDURE:  Postpartum Bilateral Tubal Sterilization using Filshie Clips   SURGEON: Dr.  Jaynie CollinsUgonna Anyanwu  ANESTHESIA:  Spinal and local analgesia using 30 ml of 0.5% Marcaine  COMPLICATIONS:  None immediate.  ESTIMATED BLOOD LOSS: 10 ml.  INDICATIONS:  [name] with undesired fertility PPD#2 status post vaginal delivery, desires permanent sterilization.  Other reversible forms of contraception were discussed with patient; she declines all other modalities. Risks of procedure discussed with patient including but not limited to: risk of regret, permanence of method, bleeding, infection, injury to surrounding organs and need for additional procedures.  Failure risk of 1 -2 % with increased risk of ectopic gestation if pregnancy occurs was also discussed with patient.      FINDINGS:  Normal uterus, tubes, and ovaries.  PROCEDURE DETAILS: The patient was taken to the operating room where her epidural anesthesia was dosed up to surgical level and found to be adequate.  She was then placed in the dorsal supine position and prepped and draped in sterile fashion.  After an adequate timeout was performed, attention was turned to the patient's abdomen where a small transverse skin incision was made under the umbilical fold. The incision was taken down to the layer of fascia using the scalpel, and fascia was incised, and extended bilaterally using Mayo scissors. The peritoneum was entered in a sharp fashion. Attention was then turned to the patient's uterus, and left fallopian tube was identified and followed out to the fimbriated end.  A Filshie clip was placed on the left fallopian tube about 3 cm from the cornual attachment, with care given to incorporate the underlying mesosalpinx.  A similar process was carried out on the right side allowing  for bilateral tubal sterilization.  Good hemostasis was noted overall. The instruments were then removed from the patient's abdomen and the fascial incision was repaired with 0 Vicryl, and the skin was closed with a 4-0 Vicryl subcuticular stitch. Local analgesia was injected around the incision. The patient tolerated the procedure well.  Instrument, sponge, and needle counts were correct times two.  The patient was then taken to the recovery room awake and in stable condition.   Jaynie CollinsUGONNA  ANYANWU, MD, FACOG Attending Obstetrician & Gynecologist Faculty Practice, [facility] [place]

## 2015-11-20 NOTE — Progress Notes (Signed)
Post Partum Day 2 Subjective: up ad lib, voiding and tolerating PO. Pt c/o gas pain in abd.  Objective: Blood pressure (!) 144/78, pulse 86, temperature 98.2 F (36.8 C), temperature source Oral, resp. rate 20, height 5\' 6"  (1.676 m), weight 154 lb (69.9 kg), last menstrual period 03/03/2015, SpO2 100 %, unknown if currently breastfeeding.  Physical Exam:  General: alert and no distress Lochia: appropriate Uterine Fundus: firm DVT Evaluation: No evidence of DVT seen on physical exam.   Recent Labs  11/18/15 1054  HGB 10.5*  HCT 31.0*    Assessment/Plan: Contraception for PP BTL this am.  Pt is NPO for the procedure. Discharge to home later today after PP BTL  Patient desires surgical management with postpartum bilateral tubal ligation.  The risks of surgery were discussed in detail with the patient including but not limited to: bleeding which may require transfusion or reoperation; infection which may require prolonged hospitalization or re-hospitalization and antibiotic therapy; injury to bowel, bladder, ureters and major vessels or other surrounding organs; need for additional procedures including laparotomy; thromboembolic phenomenon, incisional problems and other postoperative or anesthesia complications. Failure rate of 3-07/998 was quoted to pt with an increased risk of ectopic pregnancy if pregnancy does occur.  Patient was told that the likelihood that her condition and symptoms will be treated effectively with this surgical management was very high; the postoperative expectations were also discussed in detail. The patient also understands the alternative treatment options which were discussed in full. All questions were answered. Pt is NPO and ready for procedure when called.   LOS: 10 days   HARRAWAY-SMITH, Joeph Szatkowski 11/20/2015, 7:29 AM

## 2015-11-20 NOTE — Progress Notes (Signed)
Discharge teaching complete. Pt understood all information and did not have any questions. Pt ambulated out of hospital and discharged home to family.

## 2015-11-20 NOTE — Discharge Summary (Signed)
OB Discharge Summary     Patient Name: Kara Brooks DOB: [birth year] MRN: [id number]  Date of admission: 11/10/2015 Delivering MD: Cam Hai   Date of discharge: 11/20/2015  Admitting diagnosis: 32WKS, SEVERE PREECLAMPSIA Desires sterilization  Intrauterine pregnancy: [redacted]w[redacted]d      Secondary diagnosis:  Principal Problem:   Preeclampsia, severe Active Problems:   Supervision of high risk pregnancy in third trimester   AMA (advanced maternal age) multigravida 35+   Anemia affecting pregnancy   Gestational diabetes mellitus (GDM) in third trimester   IUGR (intrauterine growth restriction) affecting care of mother   Chronic hypertension   Status post postpartum tubal ligation     Discharge diagnosis: Preterm Pregnancy Delivered, Preeclampsia (severe), CHTN with superimposed preeclampsia and GDM A2                                                                                       Post partum procedures: Magnesium sulfate intrapartum and postpartum.  Postpartum tubal ligation.  Augmentation: AROM, Pitocin, Cytotec and Foley Balloon  Complications: None  Hospital course:  Induction of Labor With Vaginal Delivery   35 y.o. yo [id number] at [redacted]w[redacted]d was admitted to the hospital 11/10/2015 for severe preeclampsia at [redacted]w[redacted]d. She was monitored on Antepartum, betamethasone was given.  The plan was made was made for induction of labor at [redacted]w[redacted]d.  Indication for induction: Severe Preeclampsia.  Patient had an uncomplicated labor course as follows: Membrane Rupture Time/Date: 1:22 PM ,11/18/2015   Intrapartum Procedures: Episiotomy: None [1]                                         Lacerations:  None [1]  Patient had delivery of a Viable infant.  Information for the patient's newborn:  Dalene, Robards [[id number]]  Delivery Method: Vaginal, Spontaneous Delivery (Filed from Delivery Summary)  11/18/2015  Details of delivery can be found in separate delivery note.  Patient had a routine  postpartum course. Norvasc 10 mg prescribed for BP control. Patient is discharged home 11/20/15 after her postpartum tubal ligation that was done on the same day.   Physical exam Vitals:   11/20/15 1245 11/20/15 1315 11/20/15 1345 11/20/15 1425  BP: 136/78 135/79 139/86 (!) 141/85  Pulse: 71 71 75 80  Resp: Temp:   98.6 F (37 C) 97.6 F (36.4 C)  TempSrc:      SpO2: 100% 99% 96% 100%  Weight:      Height:       General: alert and no distress Lochia: appropriate Uterine Fundus: firm Incision: Dressing is clean, dry, and intact DVT Evaluation: No evidence of DVT seen on physical exam. Negative Homan's sign. No cords or calf tenderness. Labs: Lab Results  Component Value Date   WBC 11.2 (H) 11/18/2015   HGB 10.5 (L) 11/18/2015   HCT 31.0 (L) 11/18/2015   MCV 91.7 11/18/2015   PLT 165 11/18/2015   CMP Latest Ref Rng & Units 11/12/2015  Glucose 65 - 99 mg/dL 696(E)  BUN 6 - 20  mg/dL 14  Creatinine 4.090.44 - 8.111.00 mg/dL 9.140.75  Sodium 782135 - 956145 mmol/L 137  Potassium 3.5 - 5.1 mmol/L 4.3  Chloride 101 - 111 mmol/L 110  CO2 22 - 32 mmol/L 21(L)  Calcium 8.9 - 10.3 mg/dL 2.1(H8.8(L)  Total Protein 6.5 - 8.1 g/dL 0.8(M5.7(L)  Total Bilirubin 0.3 - 1.2 mg/dL 0.5  Alkaline Phos 38 - 126 U/L 70  AST 15 - 41 U/L 17  ALT 14 - 54 U/L 12(L)    Discharge instruction: per After Visit Summary and "Baby and Me Booklet".  After visit meds:    Medication List    STOP taking these medications   ACCU-CHEK AVIVA device   ACCU-CHEK FASTCLIX LANCETS Misc   glucose blood test strip Commonly known as:  ACCU-CHEK AVIVA     TAKE these medications   docusate sodium 100 MG capsule Commonly known as:  COLACE Take 1 capsule (100 mg total) by mouth 2 (two) times daily as needed. What changed:  when to take this  reasons to take this   ferrous sulfate 325 (65 FE) MG tablet Commonly known as:  FERROUSUL Take 1 tablet (325 mg total) by mouth 2 (two) times daily.    oxyCODONE-acetaminophen 5-325 MG tablet Commonly known as:  PERCOCET/ROXICET Take 1-2 tablets by mouth every 6 (six) hours as needed.   PRENATAL VITAMIN PO Take 1 tablet by mouth daily.   sertraline 50 MG tablet Commonly known as:  ZOLOFT Take 1 tablet (50 mg total) by mouth daily.       Diet: carb modified diet  Activity: Advance as tolerated. Pelvic rest for 6 weeks.   Outpatient follow up: This week for BP check and 4 weeks for postpartum check Follow up Appt:No future appointments. Follow up Visit:No Follow-up on file.  Postpartum contraception: Tubal Ligation  Newborn Data: Live born female  Birth Weight: 3 lb 5.3 oz (1510 g) APGAR: 8, 9  Baby Feeding: Breast Disposition:NICU   11/20/2015 Tereso NewcomerANYANWU,Anthon Harpole A, MD

## 2015-11-20 NOTE — Transfer of Care (Signed)
Immediate Anesthesia Transfer of Care Note  Patient: Kara Brooks  Procedure(s) Performed: Procedure(s): POST PARTUM TUBAL LIGATION (Bilateral)  Patient Location: PACU  Anesthesia Type:Spinal  Level of Consciousness: awake, alert , oriented and patient cooperative  Airway & Oxygen Therapy: Patient Spontanous Breathing and Patient connected to nasal cannula oxygen  Post-op Assessment: Report given to RN and Post -op Vital signs reviewed and stable  Post vital signs: Reviewed and stable  Last Vitals:  Vitals:   11/20/15 0617 11/20/15 1000  BP: (!) 144/78 (!) 149/83  Pulse: 86 87  Resp:  18  Temp:  36.6 C    Last Pain:  Vitals:   11/20/15 1000  TempSrc: Oral  PainSc: 0-No pain      Patients Stated Pain Goal: 3 (11/20/15 1000)  Complications: No apparent anesthesia complications

## 2015-11-20 NOTE — Progress Notes (Signed)
Faculty Practice OB/GYN Attending Note  35 y.o. [id number] PPD#2 s/p SVD who desires permanent sterilization.  I met with the patient, other reversible forms of contraception were discussed with patient; she declines all other modalities. Risks of procedure discussed with patient including but not limited to: risk of regret, permanence of method, bleeding, infection, injury to surrounding organs and need for additional procedures.  Failure risk of 1-2 % with increased risk of ectopic gestation if pregnancy occurs was also discussed with patient.  Patient verbalized understanding of these risks and wants to proceed with sterilization.  Written informed consent obtained.  To OR when ready.   Verita Schneiders, MD, [place] Attending [place], [facility]

## 2015-11-20 NOTE — Anesthesia Postprocedure Evaluation (Addendum)
Anesthesia Post Note  Patient: Kara Brooks  Procedure(s) Performed: Procedure(s) (LRB): POST PARTUM TUBAL LIGATION (Bilateral)  Patient location during evaluation: PACU Anesthesia Type: Spinal Level of consciousness: awake Pain management: pain level controlled Vital Signs Assessment: post-procedure vital signs reviewed and stable Respiratory status: spontaneous breathing Cardiovascular status: stable Postop Assessment: no headache, no backache, spinal receding, patient able to bend at knees and no signs of nausea or vomiting Anesthetic complications: no     Last Vitals:  Vitals:   11/20/15 1215 11/20/15 1230  BP: 125/77 140/70  Pulse: 76 92  Resp: 11 (!) 21  Temp: 37 C     Last Pain:  Vitals:   11/20/15 1000  TempSrc: Oral  PainSc: 0-No pain   Pain Goal: Patients Stated Pain Goal: 3 (11/20/15 1000)               Kara Brooks,Kara Brooks

## 2015-11-20 NOTE — Anesthesia Procedure Notes (Signed)
Spinal  Patient location during procedure: OR Start time: 11/20/2015 11:33 AM End time: 11/20/2015 11:37 AM Staffing Anesthesiologist: Leilani AbleHATCHETT, Eber Ferrufino Performed: anesthesiologist  Preanesthetic Checklist Completed: patient identified, surgical consent, pre-op evaluation, timeout performed, IV checked, risks and benefits discussed and monitors and equipment checked Spinal Block Patient position: sitting Prep: DuraPrep Patient monitoring: heart rate, cardiac monitor, continuous pulse ox and blood pressure Approach: midline Location: L2-3 Injection technique: single-shot Needle Needle type: Sprotte  Needle gauge: 24 G Needle length: 9 cm Needle insertion depth: 6 cm Assessment Sensory level: T6 Additional Notes After it was determined that the labor epidural had not provided adequate anesthesia for a PPTl the cathter was inspected and found to be not in the epidural space. It was dc/d tip intact.

## 2015-11-21 ENCOUNTER — Encounter (HOSPITAL_COMMUNITY): Payer: Self-pay | Admitting: Obstetrics & Gynecology

## 2015-11-22 ENCOUNTER — Ambulatory Visit (INDEPENDENT_AMBULATORY_CARE_PROVIDER_SITE_OTHER): Payer: 59 | Admitting: *Deleted

## 2015-11-22 VITALS — BP 149/92 | HR 97

## 2015-11-22 DIAGNOSIS — O1494 Unspecified pre-eclampsia, complicating childbirth: Secondary | ICD-10-CM

## 2015-11-22 MED ORDER — HYDROCHLOROTHIAZIDE 25 MG PO TABS: 25.0000 mg | ORAL_TABLET | Freq: Every day | ORAL | 3 refills | Status: DC

## 2015-11-22 NOTE — Progress Notes (Signed)
Pt here for BP check  Subjective:  Kara Brooks is a 35 y.o. female for follow up BP after delivery. Admitted and Delivered at 34w.   Current Outpatient Prescriptions  Medication Sig Dispense Refill  . docusate sodium (COLACE) 100 MG capsule Take 1 capsule (100 mg total) by mouth 2 (two) times daily as needed. (Patient taking differently: Take 100 mg by mouth daily as needed for mild constipation. ) 30 capsule 2  . ferrous sulfate (FERROUSUL) 325 (65 FE) MG tablet Take 1 tablet (325 mg total) by mouth 2 (two) times daily. 60 tablet 1  . oxyCODONE-acetaminophen (PERCOCET/ROXICET) 5-325 MG tablet Take 1-2 tablets by mouth every 6 (six) hours as needed. 30 tablet 0  . Prenatal Vit-Fe Fumarate-FA (PRENATAL VITAMIN PO) Take 1 tablet by mouth daily.     . sertraline (ZOLOFT) 50 MG tablet Take 1 tablet (50 mg total) by mouth daily. 30 tablet 2   No current facility-administered medications for this visit.     Objective:  BP (!) 149/92   Pulse 97   LMP 03/03/2015 (Approximate)   Appearance alert, well appearing, and in no distress. General exam BP noted to be well controlled today in office, BP noted to be mildly elevated today in office, S1, S2 normal, no gallop, no murmur, chest clear, no JVD, no HSM, no edema.   Assessment:   Hypertension needs further observation.   Plan:  The following changes are to be made: Add HCTZ 25mg  daily . Follow up approx 6wks postpartum

## 2015-11-25 ENCOUNTER — Ambulatory Visit (HOSPITAL_COMMUNITY): Payer: 59

## 2015-12-01 ENCOUNTER — Encounter (INDEPENDENT_AMBULATORY_CARE_PROVIDER_SITE_OTHER): Payer: Self-pay | Admitting: *Deleted

## 2015-12-01 DIAGNOSIS — O1414 Severe pre-eclampsia complicating childbirth: Secondary | ICD-10-CM

## 2015-12-02 ENCOUNTER — Ambulatory Visit (HOSPITAL_COMMUNITY): Payer: 59

## 2015-12-09 ENCOUNTER — Ambulatory Visit: Payer: 59 | Admitting: *Deleted

## 2015-12-09 ENCOUNTER — Encounter: Payer: Self-pay | Admitting: *Deleted

## 2015-12-09 VITALS — BP 128/88

## 2015-12-09 DIAGNOSIS — R030 Elevated blood-pressure reading, without diagnosis of hypertension: Principal | ICD-10-CM

## 2015-12-09 DIAGNOSIS — IMO0001 Reserved for inherently not codable concepts without codable children: Secondary | ICD-10-CM

## 2015-12-09 NOTE — Progress Notes (Signed)
Subjective:  Kara Brooks is a 35 y.o. female here for blood pressure check.  Current Outpatient Prescriptions  Medication Sig Dispense Refill  . docusate sodium (COLACE) 100 MG capsule Take 1 capsule (100 mg total) by mouth 2 (two) times daily as needed. (Patient taking differently: Take 100 mg by mouth daily as needed for mild constipation. ) 30 capsule 2  . ferrous sulfate (FERROUSUL) 325 (65 FE) MG tablet Take 1 tablet (325 mg total) by mouth 2 (two) times daily. 60 tablet 1  . hydrochlorothiazide (HYDRODIURIL) 25 MG tablet Take 1 tablet (25 mg total) by mouth daily. 30 tablet 3  . oxyCODONE-acetaminophen (PERCOCET/ROXICET) 5-325 MG tablet Take 1-2 tablets by mouth every 6 (six) hours as needed. 30 tablet 0  . Prenatal Vit-Fe Fumarate-FA (PRENATAL VITAMIN PO) Take 1 tablet by mouth daily.     . sertraline (ZOLOFT) 50 MG tablet Take 1 tablet (50 mg total) by mouth daily. 30 tablet 2   No current facility-administered medications for this visit.     Hypertension ROS: taking medications as instructed, no medication side effects noted, no swelling of ankles and headaches, dizzyness, and elevated home BP readings.     Objective:  BP 128/88   Appearance alert, well appearing, and in no distress and ill-appearing. General exam BP noted to be well controlled today in office.    Assessment:   Hypertension needs further observation.   Plan:  Report to [facility] for eval .

## 2015-12-14 ENCOUNTER — Encounter: Payer: Self-pay | Admitting: Emergency Medicine

## 2015-12-14 ENCOUNTER — Emergency Department
Admission: EM | Admit: 2015-12-14 | Discharge: 2015-12-15 | Disposition: A | Discharge status: Home / Self Care | Payer: 59 | Attending: Emergency Medicine | Admitting: Emergency Medicine

## 2015-12-14 DIAGNOSIS — L732 Hidradenitis suppurativa: Secondary | ICD-10-CM | POA: Diagnosis not present

## 2015-12-14 DIAGNOSIS — I10 Essential (primary) hypertension: Secondary | ICD-10-CM | POA: Insufficient documentation

## 2015-12-14 DIAGNOSIS — F17210 Nicotine dependence, cigarettes, uncomplicated: Secondary | ICD-10-CM | POA: Insufficient documentation

## 2015-12-14 DIAGNOSIS — Z79899 Other long term (current) drug therapy: Secondary | ICD-10-CM | POA: Diagnosis not present

## 2015-12-14 DIAGNOSIS — R509 Fever, unspecified: Secondary | ICD-10-CM | POA: Diagnosis present

## 2015-12-14 DIAGNOSIS — L02412 Cutaneous abscess of left axilla: Secondary | ICD-10-CM | POA: Diagnosis not present

## 2015-12-14 LAB — CBC WITH DIFFERENTIAL/PLATELET
BASOS ABS: 0.1 10*3/uL (ref 0–0.1)
Basophils Relative: 1 %
Eosinophils Absolute: 0.2 10*3/uL (ref 0–0.7)
Eosinophils Relative: 2 %
HEMATOCRIT: 37.7 % (ref 35.0–47.0)
HEMOGLOBIN: 12.7 g/dL (ref 12.0–16.0)
LYMPHS PCT: 21 %
Lymphs Abs: 2.7 10*3/uL (ref 1.0–3.6)
MCH: 31 pg (ref 26.0–34.0)
MCHC: 33.8 g/dL (ref 32.0–36.0)
MCV: 91.6 fL (ref 80.0–100.0)
Monocytes Absolute: 1 10*3/uL — ABNORMAL HIGH (ref 0.2–0.9)
Monocytes Relative: 8 %
NEUTROS ABS: 9.1 10*3/uL — AB (ref 1.4–6.5)
Neutrophils Relative %: 70 %
Platelets: 293 10*3/uL (ref 150–440)
RBC: 4.11 MIL/uL (ref 3.80–5.20)
RDW: 13.4 % (ref 11.5–14.5)
WBC: 13.2 10*3/uL — AB (ref 3.6–11.0)

## 2015-12-14 LAB — BASIC METABOLIC PANEL
ANION GAP: 8 (ref 5–15)
BUN: 14 mg/dL (ref 6–20)
CHLORIDE: 106 mmol/L (ref 101–111)
CO2: 26 mmol/L (ref 22–32)
Calcium: 9.2 mg/dL (ref 8.9–10.3)
Creatinine, Ser: 0.71 mg/dL (ref 0.44–1.00)
GFR calc Af Amer: >60 mL/min (ref >60)
GLUCOSE: 131 mg/dL — AB (ref 65–99)
POTASSIUM: 3.9 mmol/L (ref 3.5–5.1)
Sodium: 140 mmol/L (ref 135–145)

## 2015-12-14 MED ORDER — ONDANSETRON HCL 4 MG/2ML IJ SOLN
4.0000 mg | Freq: Once | INTRAMUSCULAR | Status: AC
  Administered 2015-12-14: 4 mg via INTRAVENOUS
  Filled 2015-12-14: qty 2

## 2015-12-14 MED ORDER — CLINDAMYCIN PHOSPHATE 600 MG/50ML IV SOLN
600.0000 mg | Freq: Once | INTRAVENOUS | Status: AC
  Administered 2015-12-14: 600 mg via INTRAVENOUS
  Filled 2015-12-14: qty 50

## 2015-12-14 MED ORDER — SODIUM CHLORIDE 0.9 % IV BOLUS (SEPSIS)
1000.0000 mL | Freq: Once | INTRAVENOUS | Status: AC
  Administered 2015-12-14: 1000 mL via INTRAVENOUS

## 2015-12-14 MED ORDER — KETOROLAC TROMETHAMINE 30 MG/ML IJ SOLN
15.0000 mg | Freq: Once | INTRAMUSCULAR | Status: AC
  Administered 2015-12-14: 15 mg via INTRAVENOUS
  Filled 2015-12-14: qty 1

## 2015-12-14 MED ORDER — MORPHINE SULFATE (PF) 4 MG/ML IV SOLN
4.0000 mg | Freq: Once | INTRAVENOUS | Status: AC
Start: 2015-12-14 — End: 2015-12-14
  Administered 2015-12-14: 4 mg via INTRAVENOUS
  Filled 2015-12-14: qty 1

## 2015-12-14 NOTE — ED Triage Notes (Addendum)
Pt ambulatory to triage with steady gait with c/o fever x days, max tempt today was 103. Pt reports tool Tylenol at 6 pm tonight. Pt also reports abscess LEFT arm since 7/15. Hx of hidradenitis suppurativa.  Pt reports she normally gets them and they are relieved by warm compress. Pt is alert and oriented x 4, no increased work in breathing noted.

## 2015-12-14 NOTE — ED Provider Notes (Signed)
[facility] Emergency Department Provider Note   ____________________________________________   First MD Initiated Contact with Patient 12/14/15 2323     (approximate)  I have reviewed the triage vital signs and the nursing notes.   HISTORY  Chief Complaint Fever and Abscess    HPI Kara Brooks is a 34 y.o. female who presents to the ED from home with a chief complaint of fever and abscess. Patient has a history of hidradenitis suppurativa who had a wide excision of her right axilla last year by [facility] plastic surgery. States she has had abscess to her left axilla since 8/15 after giving birth.Patient is not breast feeding. Reports she began to apply warm compresses and the area has been draining for the past few days, but she began to ran fevers 2 days ago. Maximum temperature 103F. Denies chest pain, shortness of breath, abdominal pain, nausea, vomiting, diarrhea. She called for an appointment with her plastic surgeon but could not obtain one until October. She spoke with the on-call plastic surgeon tonight and was directed to her local ED for evaluation. Denies recent travel or trauma. Nothing makes her symptoms better or worse.   Past Medical History:  Diagnosis Date  . Endometriosis   . History of hidradenitis suppurativa 2016  . Hypertension     Patient Active Problem List   Diagnosis Date Noted  . Status post postpartum tubal ligation   . Chronic hypertension 11/18/2015  . IUGR (intrauterine growth restriction) affecting care of mother 11/11/2015  . Preeclampsia, severe 11/10/2015  . Gestational diabetes mellitus (GDM) in third trimester 10/14/2015  . Anemia affecting pregnancy 07/27/2015  . Supervision of high risk pregnancy in third trimester 05/17/2015  . AMA (advanced maternal age) multigravida 35+ 05/17/2015    Past Surgical History:  Procedure Laterality Date  . Laprascopy  2007  . Sweat gland removal  2016  . TUBAL LIGATION  Bilateral 11/20/2015   Procedure: POST PARTUM TUBAL LIGATION;  Surgeon: Tereso Newcomer, MD;  Location: [facility];  Service: Gynecology;  Laterality: Bilateral;    Prior to Admission medications   Medication Sig Start Date End Date Taking? Authorizing Provider  calcium carbonate (OS-CAL) 1250 (500 Ca) MG chewable tablet Chew 1 tablet by mouth daily.   Yes Historical Provider, MD  docusate sodium (COLACE) 100 MG capsule Take 1 capsule (100 mg total) by mouth 2 (two) times daily as needed. Patient taking differently: Take 100 mg by mouth daily as needed for mild constipation.  07/27/15  Yes Lori A Clemmons, CNM  ferrous sulfate (FERROUSUL) 325 (65 FE) MG tablet Take 1 tablet (325 mg total) by mouth 2 (two) times daily. 07/27/15  Yes Lori A Clemmons, CNM  hydrochlorothiazide (HYDRODIURIL) 25 MG tablet Take 1 tablet (25 mg total) by mouth daily. 11/22/15  Yes Willodean Rosenthal, MD  Multiple Vitamins-Minerals (MULTIVITAMIN WITH MINERALS) tablet Take 1 tablet by mouth daily.   Yes Historical Provider, MD  sertraline (ZOLOFT) 50 MG tablet Take 1 tablet (50 mg total) by mouth daily. 11/20/15  Yes Tereso Newcomer, MD  thiamine (VITAMIN B-1) 50 MG tablet Take 50 mg by mouth daily.   Yes Historical Provider, MD  oxyCODONE-acetaminophen (PERCOCET/ROXICET) 5-325 MG tablet Take 1-2 tablets by mouth every 6 (six) hours as needed. Patient not taking: Reported on 12/14/2015 11/20/15   Tereso Newcomer, MD    Allergies Flagyl [metronidazole] and Sulfa antibiotics  Family History  Problem Relation Age of Onset  . Hypertension Mother   .  Diabetes Mother   . Depression Mother     Social History Social History  Substance Use Topics  . Smoking status: Current Some Day Smoker    Years: 15.00    Types: Cigarettes  . Smokeless tobacco: Never Used  . Alcohol use No    Review of Systems  Constitutional: Positive for fever/chills. Eyes: No visual changes. ENT: No sore throat. Cardiovascular: Denies chest  pain. Respiratory: Denies shortness of breath. Gastrointestinal: No abdominal pain.  No nausea, no vomiting.  No diarrhea.  No constipation. Genitourinary: Negative for dysuria. Musculoskeletal: Negative for back pain. Skin: Positive for abscess. Negative for rash. Neurological: Negative for headaches, focal weakness or numbness.  10-point ROS otherwise negative.  ____________________________________________   PHYSICAL EXAM:  VITAL SIGNS: ED Triage Vitals  Enc Vitals Group     BP 12/14/15 2047 (!) 152/92     Pulse Rate 12/14/15 2047 (!) 117     Resp 12/14/15 2047 18     Temp 12/14/15 2047 100.2 F (37.9 C)     Temp Source 12/14/15 2047 Oral     SpO2 12/14/15 2047 100 %     Weight 12/14/15 2048 165 lb (74.8 kg)     Height 12/14/15 2048 5\' 6"  (1.676 m)     Head Circumference --      Peak Flow --      Pain Score 12/14/15 2048 10     Pain Loc --      Pain Edu? --      Excl. in GC? --     Constitutional: Alert and oriented. Well appearing and in mild acute distress. Eyes: Conjunctivae are normal. PERRL. EOMI. Head: Atraumatic. Nose: No congestion/rhinnorhea. Mouth/Throat: Mucous membranes are moist.  Oropharynx non-erythematous. Neck: No stridor.   Cardiovascular: Normal rate, regular rhythm. Grossly normal heart sounds.  Good peripheral circulation. Respiratory: Normal respiratory effort.  No retractions. Lungs CTAB. Gastrointestinal: Soft and nontender. No distention. No abdominal bruits. No CVA tenderness. Musculoskeletal: No lower extremity tenderness nor edema.  No joint effusions. Neurologic:  Normal speech and language. No gross focal neurologic deficits are appreciated. No gait instability. Skin:  Large, palm-sized abscess to left axilla which is actively draining. Skin is warm, dry and intact. No rash noted. Psychiatric: Mood and affect are normal. Speech and behavior are normal.  ____________________________________________   LABS (all labs ordered are listed,  but only abnormal results are displayed)  Labs Reviewed  CBC WITH DIFFERENTIAL/PLATELET - Abnormal; Notable for the following:       Result Value   WBC 13.2 (*)    Neutro Abs 9.1 (*)    Monocytes Absolute 1.0 (*)    All other components within normal limits  BASIC METABOLIC PANEL - Abnormal; Notable for the following:    Glucose, Bld 131 (*)    All other components within normal limits  CULTURE, BLOOD (ROUTINE X 2)  CULTURE, BLOOD (ROUTINE X 2)   ____________________________________________  EKG  None ____________________________________________  RADIOLOGY  None ____________________________________________   PROCEDURES  Procedure(s) performed: None  Procedures  Critical Care performed: No  ____________________________________________   INITIAL IMPRESSION / ASSESSMENT AND PLAN / ED COURSE  Pertinent labs & imaging results that were available during my care of the patient were reviewed by me and considered in my medical decision making (see chart for details).  35 year old female with a history of hidradenitis suppurtiva who presents with large draining abscess to left axilla. Abscess is too large for local incision and drainage and will require  surgical I&D. Discussed with patient and recommended hospitalization for IV antibiotics and surgical I&D. Patient very much desires to go home as she has a three-week old daughter at home. Also, she desires to have surgery with her plastic surgeon, Dr. Almedia Balls. Will obtain blood cultures, initiate IV clindamycin, and I will call [facility] plastic surgery to obtain close follow-up for patient.  Clinical Course  Comment By Time  Discussed with Dr. Lucretia Roers [facility] plastic surgeon on-call) who will notify Dr. Almedia Balls and clinic will call patient in the next day or 2 to schedule an appointment. Irean Hong, MD 09/06 2353  IV antibiotics completed. Patient feeling better. Will discharge with prescription of clindamycin, analgesia and close f/u with  her surgeon. Strict return precautions given. Patient verbalizes understanding and agrees with plan of care Irean Hong, MD 09/07 0104     ____________________________________________   FINAL CLINICAL IMPRESSION(S) / ED DIAGNOSES  Final diagnoses:  Abscess of left axilla  Fever, unspecified fever cause  Hidradenitis suppurativa of left axilla      NEW MEDICATIONS STARTED DURING THIS VISIT:  New Prescriptions   No medications on file     Note:  This document was prepared using Dragon voice recognition software and may include unintentional dictation errors.    Irean Hong, MD 12/15/15 [phone]

## 2015-12-15 MED ORDER — CLINDAMYCIN HCL 300 MG PO CAPS: 300.0000 mg | ORAL_CAPSULE | Freq: Three times a day (TID) | ORAL | 0 refills | Status: DC

## 2015-12-15 MED ORDER — HYDROCODONE-ACETAMINOPHEN 5-325 MG PO TABS: 1.0000 | ORAL_TABLET | Freq: Four times a day (QID) | ORAL | 0 refills | Status: DC | PRN

## 2015-12-15 MED ORDER — IBUPROFEN 800 MG PO TABS: 800.0000 mg | ORAL_TABLET | Freq: Three times a day (TID) | ORAL | 0 refills | Status: DC | PRN

## 2015-12-15 NOTE — Progress Notes (Signed)
Agree with nurses's documentation of this patient's clinic encounter.  Patient was complaining of persistent headaches not relieved by tylenol and visual changes. She was sent to [facility] for further evaluation  Kara AntiguaPeggy Nakenya Theall, MD

## 2015-12-15 NOTE — Discharge Instructions (Signed)
1. You may take pain medicines as needed (Motrin/Norco). 2. Take antibiotic as prescribed (clindamycin 300 mg  3 times daily 10 days). 3. Your surgeon's office should call you tomorrow or the next day. If you do not hear from them, please call to schedule a close follow-up appointment. 4. Blood cultures are pending. You will be notified of any positive results. 5. Return to the ER for worsening symptoms, persistent vomiting, difficulty breathing or other concerns.

## 2015-12-19 LAB — CULTURE, BLOOD (ROUTINE X 2)
CULTURE: NO GROWTH
CULTURE: NO GROWTH

## 2015-12-20 ENCOUNTER — Ambulatory Visit: Payer: 59 | Admitting: Obstetrics & Gynecology

## 2015-12-23 ENCOUNTER — Encounter: Payer: Self-pay | Admitting: Obstetrics and Gynecology

## 2015-12-23 ENCOUNTER — Ambulatory Visit (INDEPENDENT_AMBULATORY_CARE_PROVIDER_SITE_OTHER): Payer: 59 | Admitting: Obstetrics and Gynecology

## 2015-12-23 VITALS — BP 129/84 | HR 72 | Resp 18 | Ht 67.0 in | Wt 170.0 lb

## 2015-12-23 DIAGNOSIS — Z23 Encounter for immunization: Secondary | ICD-10-CM | POA: Diagnosis not present

## 2015-12-23 DIAGNOSIS — O0993 Supervision of high risk pregnancy, unspecified, third trimester: Secondary | ICD-10-CM

## 2015-12-23 DIAGNOSIS — F53 Postpartum depression: Secondary | ICD-10-CM

## 2015-12-23 DIAGNOSIS — O99345 Other mental disorders complicating the puerperium: Secondary | ICD-10-CM

## 2015-12-23 MED ORDER — ZOLPIDEM TARTRATE 10 MG PO TABS: 10.0000 mg | ORAL_TABLET | Freq: Every evening | ORAL | 3 refills | Status: AC | PRN

## 2015-12-23 MED ORDER — SERTRALINE HCL 100 MG PO TABS: 100.0000 mg | ORAL_TABLET | Freq: Every day | ORAL | 3 refills | Status: DC

## 2015-12-23 NOTE — Progress Notes (Signed)
Post Partum Exam  Kara Brooks is a 35 y.o. [phone] female who presents for a postpartum visit. She is 4 weeks postpartum following a spontaneous vaginal delivery. I have fully reviewed the prenatal and intrapartum course. The delivery was at 34 gestational weeks.  Anesthesia: epidural. Postpartum course has been remarkable other than abscess in the axillary region of the left arm. Baby's course has been unremarkable, discharged from NICU on 12-18-15. Baby is feeding by bottle - Similac Neosure. Bleeding no bleeding. Bowel function is normal. Bladder function is normal. Patient is not sexually active. Contraception method is tubal ligation. Postpartum depression screening:positive. Patient denies suicidal/homicidal ideations. She is currently on Zoloft and desires to have it increased     Review of Systems Pertinent items are noted in HPI.   Objective:    BP 116/78 mmHg  Pulse 78  Resp 16  Ht 5\' 5"  (1.651 m)  Wt 211 lb (95.709 kg)  BMI 35.11 kg/m2  Breastfeeding? Yes  General:  alert, cooperative and no distress   Breasts:  inspection negative, no nipple discharge or bleeding, no masses or nodularity palpable  Lungs: clear to auscultation bilaterally  Heart:  regular rate and rhythm  Abdomen: soft, non-tender; bowel sounds normal; no masses,  no organomegaly and umbilical incision healed   Vulva:  normal  Vagina: normal vagina, no discharge, exudate, lesion, or erythema  Cervix:  multiparous appearance  Corpus: normal size, contour, position, consistency, mobility, non-tender  Adnexa:  no mass, fullness, tenderness  Rectal Exam: Not performed.        Assessment:    Normal postpartum exam. Pap smear not done at today's visit.   Plan:    1. Contraception: tubal ligation 2. Patient is medically cleared to resume all activities of daily living Advised patient to follow up with PCP regarding GHTN which is well controlled.  Rx Zoloft 100 mg daily  Provided. Referral to behavioral  health also provided 3. Follow up in: as needed.

## 2015-12-27 ENCOUNTER — Institutional Professional Consult (permissible substitution): Payer: 59

## 2016-01-03 ENCOUNTER — Encounter: Payer: Self-pay | Admitting: *Deleted

## 2017-01-23 ENCOUNTER — Encounter: Payer: Self-pay | Admitting: Emergency Medicine

## 2017-01-23 ENCOUNTER — Emergency Department
Admission: EM | Admit: 2017-01-23 | Discharge: 2017-01-23 | Disposition: A | Discharge status: Home / Self Care | Payer: 59 | Attending: Emergency Medicine | Admitting: Emergency Medicine

## 2017-01-23 DIAGNOSIS — L0291 Cutaneous abscess, unspecified: Secondary | ICD-10-CM

## 2017-01-23 DIAGNOSIS — L02416 Cutaneous abscess of left lower limb: Secondary | ICD-10-CM | POA: Insufficient documentation

## 2017-01-23 DIAGNOSIS — F17210 Nicotine dependence, cigarettes, uncomplicated: Secondary | ICD-10-CM | POA: Insufficient documentation

## 2017-01-23 DIAGNOSIS — I10 Essential (primary) hypertension: Secondary | ICD-10-CM | POA: Insufficient documentation

## 2017-01-23 DIAGNOSIS — Z79899 Other long term (current) drug therapy: Secondary | ICD-10-CM | POA: Insufficient documentation

## 2017-01-23 MED ORDER — IBUPROFEN 600 MG PO TABS
600.0000 mg | ORAL_TABLET | Freq: Once | ORAL | Status: AC
  Administered 2017-01-23: 600 mg via ORAL
  Filled 2017-01-23: qty 1

## 2017-01-23 MED ORDER — OXYCODONE-ACETAMINOPHEN 5-325 MG PO TABS
1.0000 | ORAL_TABLET | Freq: Once | ORAL | Status: AC
  Administered 2017-01-23: 1 via ORAL
  Filled 2017-01-23: qty 1

## 2017-01-23 MED ORDER — IBUPROFEN 600 MG PO TABS: 600.0000 mg | ORAL_TABLET | Freq: Three times a day (TID) | ORAL | 0 refills | Status: DC | PRN

## 2017-01-23 MED ORDER — TRAMADOL HCL 50 MG PO TABS: 50.0000 mg | ORAL_TABLET | Freq: Four times a day (QID) | ORAL | 0 refills | Status: DC | PRN

## 2017-01-23 MED ORDER — CLINDAMYCIN HCL 150 MG PO CAPS: 150.0000 mg | ORAL_CAPSULE | Freq: Four times a day (QID) | ORAL | 0 refills | Status: DC

## 2017-01-23 NOTE — ED Provider Notes (Signed)
[facility] Emergency Department Provider Note    ____________________________________________   First MD Initiated Contact with Patient 01/23/17 [phone]     (approximate)  I have reviewed the triage vital signs and the nursing notes.   HISTORY  Chief Complaint Leg Pain and Abscess    HPI Kara Brooks is a 36 y.o. female patient complain abscess to the left upper leg 2-3 days. Patient at least has increased secondary to manipulation. Patient denies any drainage from the lesion.Patient rates the pain as 8/10.No palliative measures for complaint. She has a history Hidradenitis. Patient blood pressures elevated but she admits to not take her medicine this morning.   Past Medical History:  Diagnosis Date  . Endometriosis   . History of hidradenitis suppurativa 2016  . Hypertension     Patient Active Problem List   Diagnosis Date Noted  . Status post postpartum tubal ligation   . Chronic hypertension 11/18/2015  . IUGR (intrauterine growth restriction) affecting care of mother 11/11/2015  . Preeclampsia, severe 11/10/2015  . Gestational diabetes mellitus (GDM) in third trimester 10/14/2015  . Anemia affecting pregnancy 07/27/2015  . Supervision of high risk pregnancy in third trimester 05/17/2015  . AMA (advanced maternal age) multigravida 35+ 05/17/2015    Past Surgical History:  Procedure Laterality Date  . Laprascopy  2007  . Sweat gland removal  2016  . TUBAL LIGATION Bilateral 11/20/2015   Procedure: POST PARTUM TUBAL LIGATION;  Surgeon: Tereso NewcomerUgonna A Anyanwu, MD;  Location: [facility];  Service: Gynecology;  Laterality: Bilateral;    Prior to Admission medications   Medication Sig Start Date End Date Taking? Authorizing Provider  calcium carbonate (OS-CAL) 1250 (500 Ca) MG chewable tablet Chew 1 tablet by mouth daily.    [provider]  clindamycin (CLEOCIN) 150 MG capsule Take 1 capsule (150 mg total) by mouth 4 (four) times daily.  01/23/17   Joni ReiningSmith, Meaghan Whistler K, PA-C  clindamycin (CLEOCIN) 300 MG capsule Take 1 capsule (300 mg total) by mouth 3 (three) times daily. 12/15/15   Irean HongSung, Jade J, MD  docusate sodium (COLACE) 100 MG capsule Take 1 capsule (100 mg total) by mouth 2 (two) times daily as needed. Patient not taking: Reported on 12/23/2015 07/27/15   Clemmons, Elmore GuiseLori A, CNM  ferrous sulfate (FERROUSUL) 325 (65 FE) MG tablet Take 1 tablet (325 mg total) by mouth 2 (two) times daily. Patient not taking: Reported on 12/23/2015 07/27/15   Clemmons, Elmore GuiseLori A, CNM  hydrochlorothiazide (HYDRODIURIL) 25 MG tablet Take 1 tablet (25 mg total) by mouth daily. 11/22/15   Willodean RosenthalHarraway-Rekisha Welling, Carolyn, MD  HYDROcodone-acetaminophen (NORCO) 5-325 MG tablet Take 1 tablet by mouth every 6 (six) hours as needed for moderate pain. Patient not taking: Reported on 12/23/2015 12/15/15   Irean HongSung, Jade J, MD  ibuprofen (ADVIL,MOTRIN) 600 MG tablet Take 1 tablet (600 mg total) by mouth every 8 (eight) hours as needed. 01/23/17   Joni ReiningSmith, Amil Bouwman K, PA-C  ibuprofen (ADVIL,MOTRIN) 800 MG tablet Take 1 tablet (800 mg total) by mouth every 8 (eight) hours as needed for moderate pain. 12/15/15   Irean HongSung, Jade J, MD  Multiple Vitamins-Minerals (MULTIVITAMIN WITH MINERALS) tablet Take 1 tablet by mouth daily.    [provider]  sertraline (ZOLOFT) 100 MG tablet Take 1 tablet (100 mg total) by mouth daily. 12/23/15   Constant, Peggy, MD  traMADol (ULTRAM) 50 MG tablet Take 1 tablet (50 mg total) by mouth every 6 (six) hours as needed. 01/23/17 01/23/18  Katrinka BlazingSmith,  Arther Abbott, PA-C  zolpidem (AMBIEN) 10 MG tablet Take 1 tablet (10 mg total) by mouth at bedtime as needed for sleep. 12/23/15 01/22/16  Constant, Peggy, MD    Allergies Flagyl [metronidazole] and Sulfa antibiotics  Family History  Problem Relation Age of Onset  . Hypertension Mother   . Diabetes Mother   . Depression Mother     Social History Social History  Substance Use Topics  . Smoking status: Current Some  Day Smoker    Packs/day: 0.25    Years: 15.00    Types: Cigarettes  . Smokeless tobacco: Never Used  . Alcohol use No    Review of Systems  Constitutional: No fever/chills Eyes: No visual changes. ENT: No sore throat. Cardiovascular: Denies chest pain. Respiratory: Denies shortness of breath. Gastrointestinal: No abdominal pain.  No nausea, no vomiting.  No diarrhea.  No constipation. Genitourinary: Negative for dysuria. Musculoskeletal: Negative for back pain. Skin: Negative for rash. Neurological: Negative for headaches, focal weakness or numbness. Endocrine:hypertension Allergic/Immunilogical:Sulfur____________________________________________   PHYSICAL EXAM:  VITAL SIGNS: ED Triage Vitals  Enc Vitals Group     BP 01/23/17 0802 (!) 171/106     Pulse Rate 01/23/17 0802 (!) 106     Resp 01/23/17 0802 18     Temp 01/23/17 0802 97.6 F (36.4 C)     Temp Source 01/23/17 0802 Oral     SpO2 01/23/17 0802 100 %     Weight --      Height --      Head Circumference --      Peak Flow --      Pain Score 01/23/17 0800 8     Pain Loc --      Pain Edu? --      Excl. in GC? --     Constitutional: Alert and oriented. Well appearing and in no acute distress. Cardiovascular: Normal rate, regular rhythm. Grossly normal heart sounds.  Good peripheral circulation. Elevated blood pressure Respiratory: Normal respiratory effort.  No retractions. Lungs CTAB. Gastrointestinal: Soft and nontender. No distention. No abdominal bruits. No CVA tenderness. Musculoskeletal: No lower extremity tenderness nor edema.  No joint effusions. Neurologic:  Normal speech and language. No gross focal neurologic deficits are appreciated. No gait instability. Skin:  Skin is warm, dry and intact. No rash noted. nonfluctuant nodular lesion left inner thigh Psychiatric: Mood and affect are normal. Speech and behavior are normal.  ____________________________________________   LABS (all labs ordered are  listed, but only abnormal results are displayed)  Labs Reviewed - No data to display ____________________________________________  EKG   ____________________________________________  RADIOLOGY  No results found.  ____________________________________________   PROCEDURES  Procedure(s) performed: None  Procedures  Critical Care performed: No  ____________________________________________   INITIAL IMPRESSION / ASSESSMENT AND PLAN / ED COURSE  As part of my medical decision making, I reviewed the following data within the electronic MEDICAL RECORD NUMBER    Abscess left inner thigh. Discussed with patient rationale for not incise and drain area at this time. Patient given discharge care instruction. Patient advised take medication as directed. [facility]      ____________________________________________   FINAL CLINICAL IMPRESSION(S) / ED DIAGNOSES  Final diagnoses:  Abscess      NEW MEDICATIONS STARTED DURING THIS VISIT:  New Prescriptions   CLINDAMYCIN (CLEOCIN) 150 MG CAPSULE    Take 1 capsule (150 mg total) by mouth 4 (four) times daily.   IBUPROFEN (ADVIL,MOTRIN) 600 MG TABLET  Take 1 tablet (600 mg total) by mouth every 8 (eight) hours as needed.   TRAMADOL (ULTRAM) 50 MG TABLET    Take 1 tablet (50 mg total) by mouth every 6 (six) hours as needed.     Note:  This document was prepared using Dragon voice recognition software and may include unintentional dictation errors.    Joni Reining, PA-C 01/23/17 0845    Rockne Menghini, MD 01/23/17 [phone]

## 2017-01-23 NOTE — ED Triage Notes (Addendum)
abscess to left  upper leg, hx of same.

## 2017-06-20 IMAGING — US US MFM OB COMP +14 WKS
1 series · 14 of 28 positions shown · non-contrast
Comparison: none

[Series 1: us mfm ob comp +14 wks · 66 acquisitions, 14 frames shown]
[im 3/66]
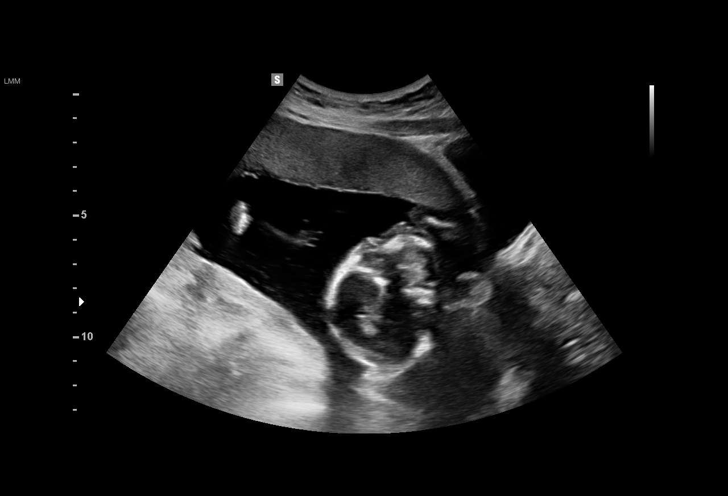
[im 8/66]
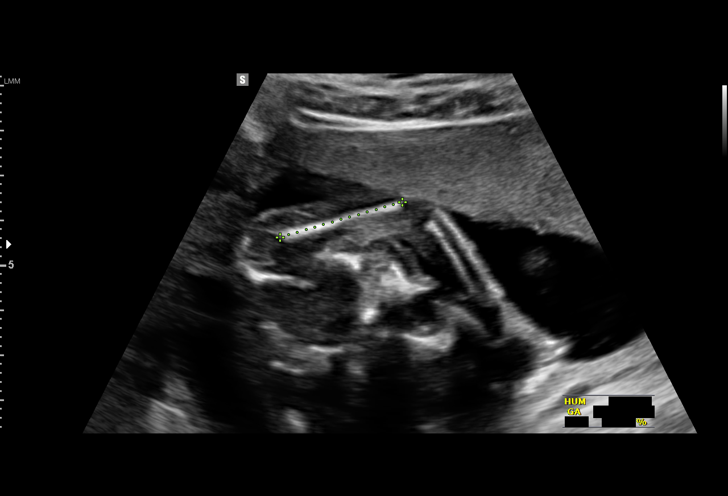
[im 13/66]
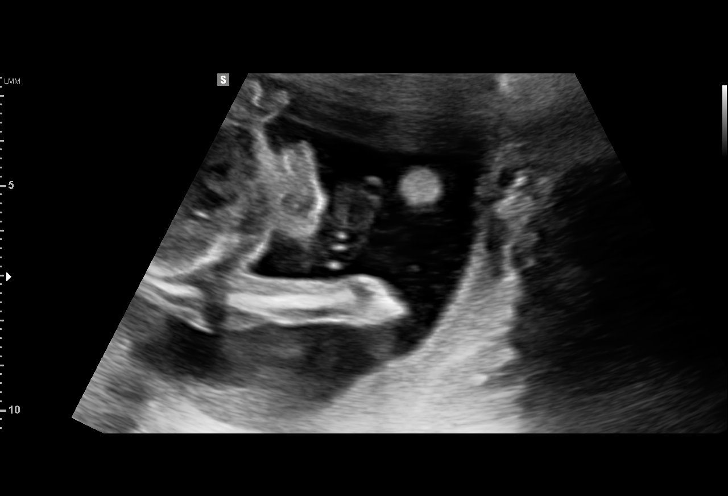
[im 17/66]
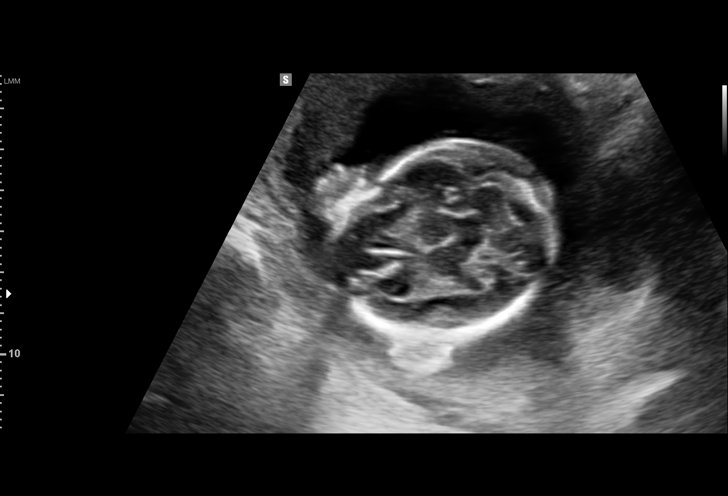
[im 22/66]
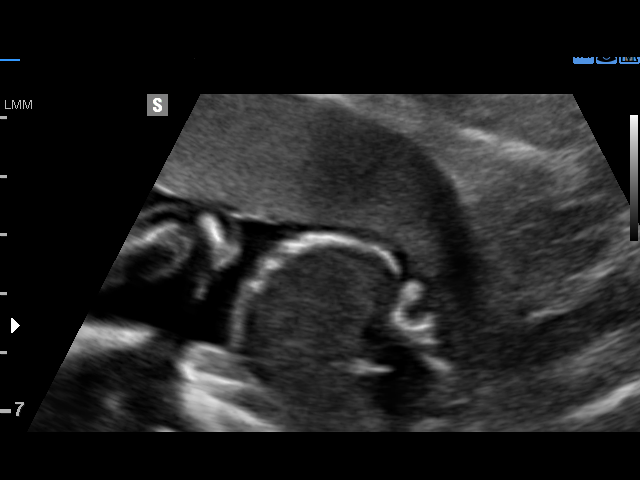
[im 27/66]
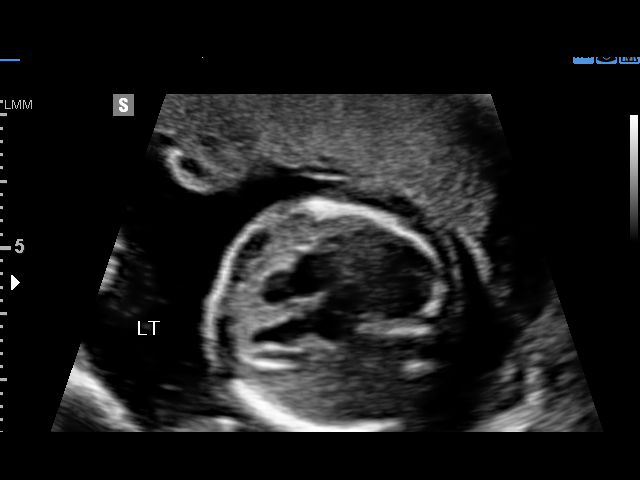
[im 32/66]
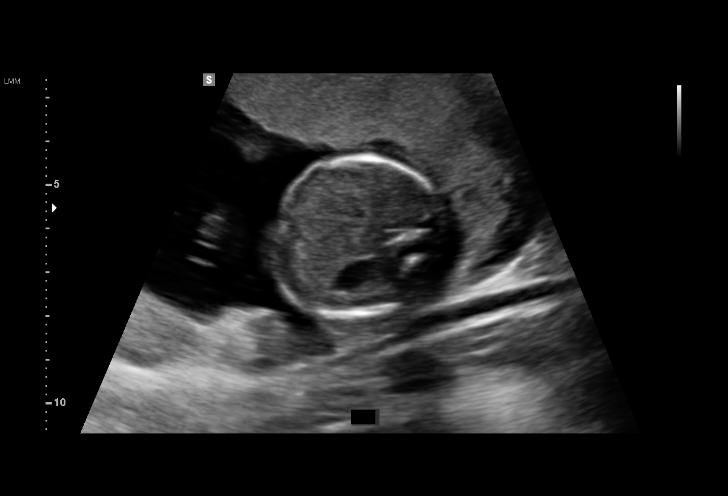
[im 37/66]
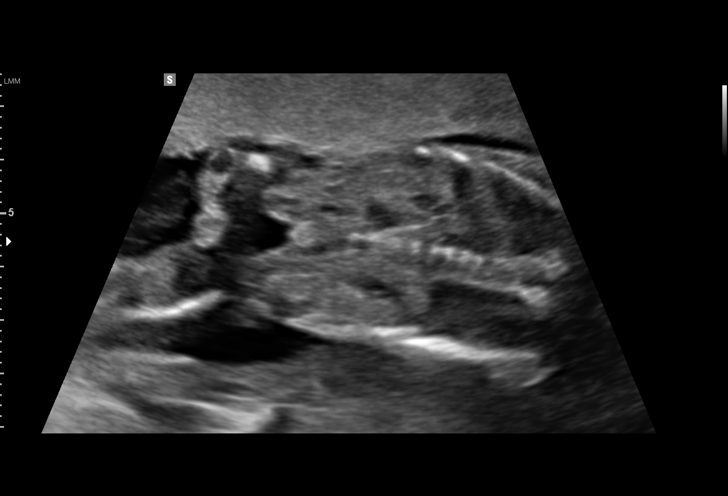
[im 41/66]
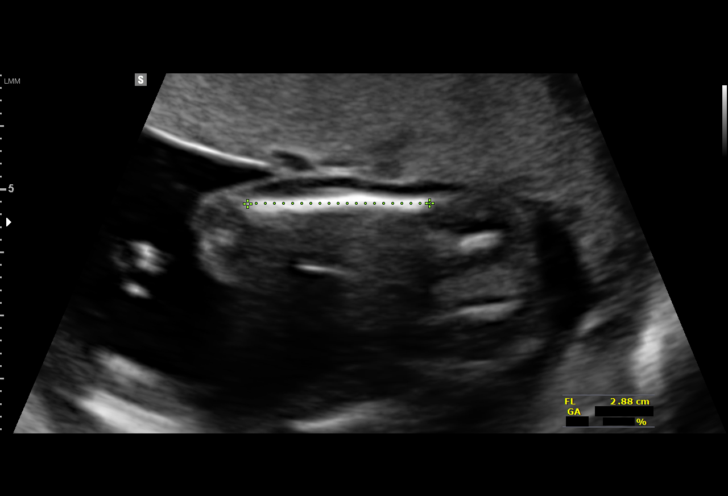
[im 46/66]
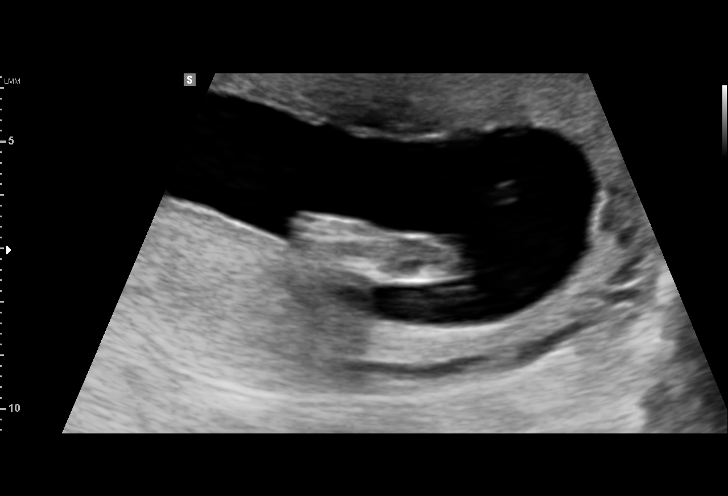
[im 51/66]
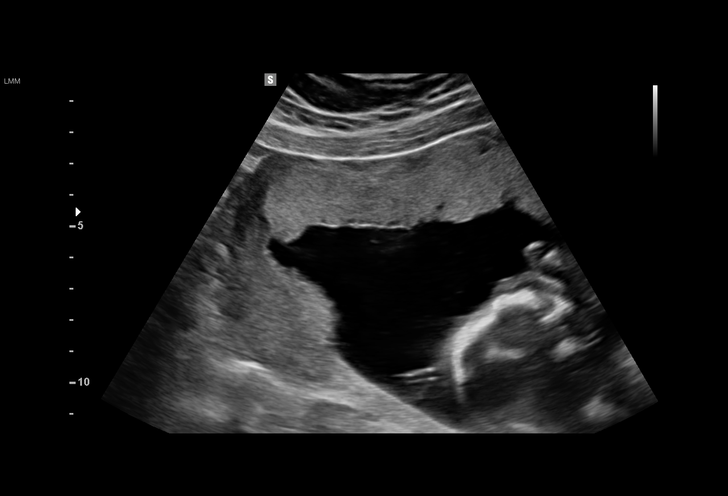
[im 56/66]
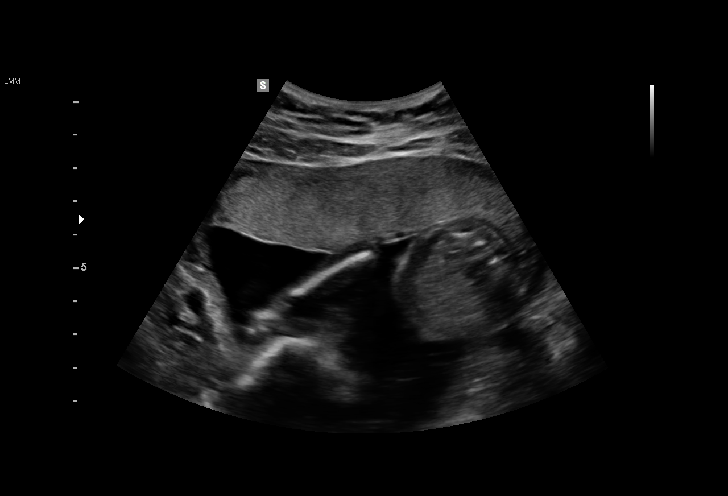
[im 61/66]
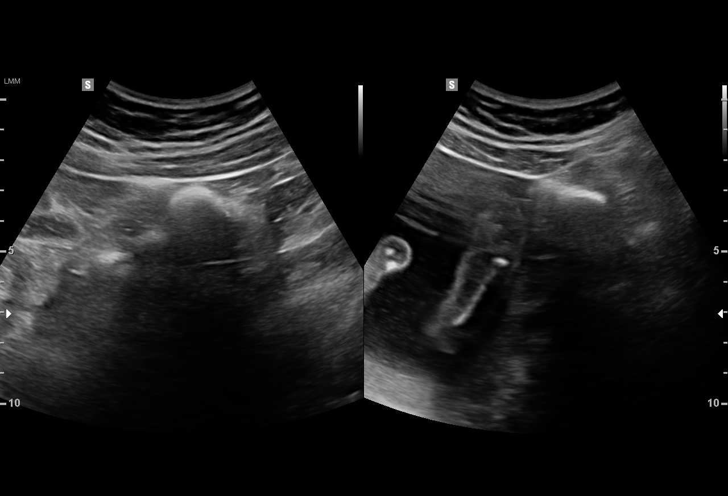
[im 66/66]
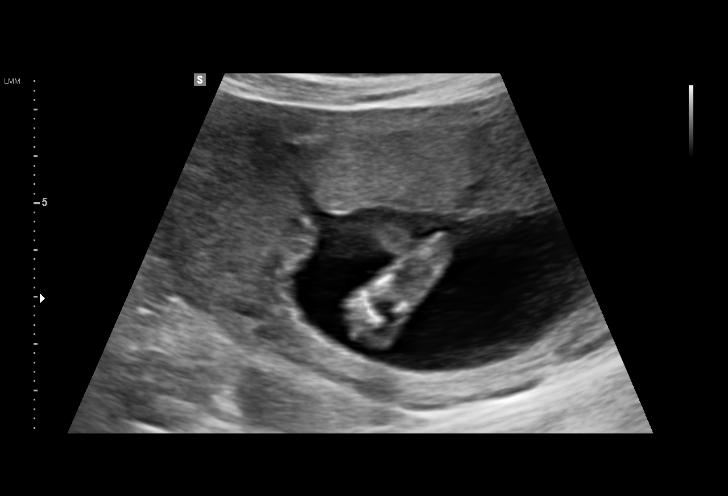

[14 of 28 positions shown; findings below may reference images not displayed]

1  KEERTHI SHAE            [id number]      [id number]     [id number]
Indications

19 weeks gestation of pregnancy
Basic anatomic survey                          Z36
Advanced maternal age multigravida 35+,
second trimester
Poor obstetric history: Previous fetal growth
restriction (FGR)
OB History

Gravidity:    3         Term:   1        Prem:   1
Living:       2
Fetal Evaluation

Num Of Fetuses:     1
Fetal Heart         154
Rate(bpm):
Cardiac Activity:   Observed
Presentation:       Cephalic
Placenta:           Anterior, above cervical os
P. Cord Insertion:  Visualized, central

Amniotic Fluid
AFI FV:      Subjectively within normal limits

Largest Pocket(cm)
5.9
Biometry

BPD:      45.7  mm     G. Age:  19w 6d         78  %    CI:        77.02   %   70 - 86
FL/HC:      17.8   %   16.1 -
HC:      164.9  mm     G. Age:  19w 1d         46  %    HC/AC:      1.28       1.09 -
AC:      129.3  mm     G. Age:  18w 4d         25  %    FL/BPD:     64.1   %
FL:       29.3  mm     G. Age:  19w 0d         39  %    FL/AC:      22.7   %   20 - 24
HUM:      28.1  mm     G. Age:  19w 0d         48  %

Est. FW:     259  gm      0 lb 9 oz     39  %
Gestational Age

LMP:           22w 1d       Date:   03/03/15                 EDD:   12/08/15
U/S Today:     19w 1d                                        EDD:   12/29/15
Best:          19w 1d    Det. By:   U/S (08/05/15)           EDD:   12/29/15
Anatomy

Cranium:               Appears normal         LVOT:                   Appears normal
Cavum:                 Appears normal         Aortic Arch:            Not well visualized
Ventricles:            Appears normal         Ductal Arch:            Not well visualized
Choroid Plexus:        Appears normal         Diaphragm:              Appears normal
Cerebellum:            Appears normal         Stomach:                Appears normal, left
sided
Posterior Fossa:       Appears normal         Abdomen:                Appears normal
Nuchal Fold:           Not well visualized    Abdominal Wall:         Appears nml (cord
insert, abd wall)
Face:                  Appears normal         Cord Vessels:           Appears normal (3
(orbits and profile)                           vessel cord)
Lips:                  Not well visualized    Kidneys:                Appear normal
Palate:                Not well visualized    Bladder:                Appears normal
Thoracic:              Appears normal         Spine:                  Appears normal
Heart:                 Appears normal         Upper Extremities:      Appears normal
(4CH, axis, and
situs)
RVOT:                  Not well visualized    Lower Extremities:      Appears normal

Other:  Fetus appears to be a male. Heels appear normal. Technically
difficult due to fetal position.
Cervix Uterus Adnexa

Cervix
Length:            2.9  cm.
Normal appearance by transabdominal scan.

Uterus
No abnormality visualized.

Left Ovary
Not visualized.

Right Ovary
Not visualized.

Cul De Sac:   No free fluid seen.

Adnexa:       No abnormality visualized.
Impression

Single IUP at 19w 1d
Advanced maternal age, hx of IUGR
Limited views of the fetal heart and face (lips) obtained
The remainder of the fetal anatomy appears normal
Anterior placenta without previa
Normal amniotic fluid volume
Recommendations

Recommend follow-up ultrasound examination in 4-6 weeks
to reevaluate the fetal heart and face

## 2017-09-19 IMAGING — US US MFM FETAL BPP W/O NON-STRESS
1 series · 13 of 28 positions shown · non-contrast
Comparison: none

[Series 1: us mfm fetal bpp w/o non-stress · 53 acquisitions, 13 frames shown]
[im 2/53]
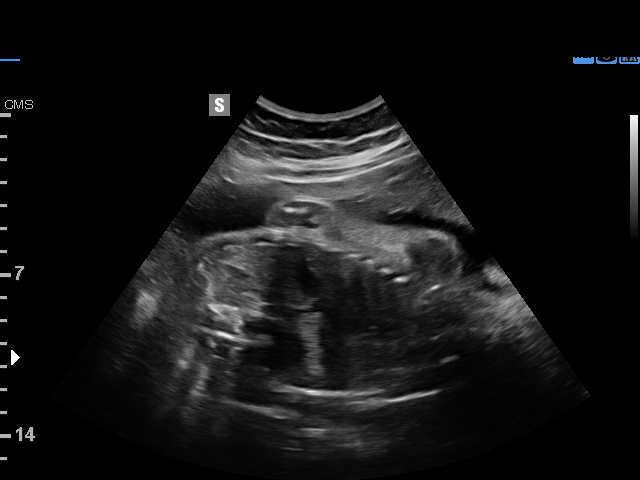
[im 6/53]
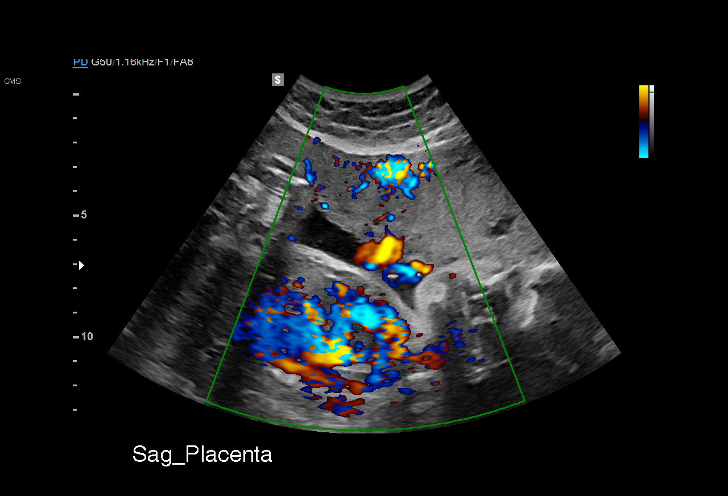
[im 10/53]
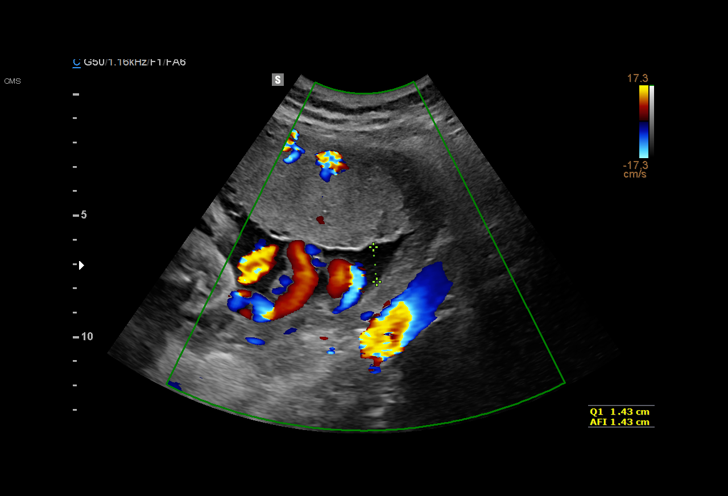
[im 14/53]
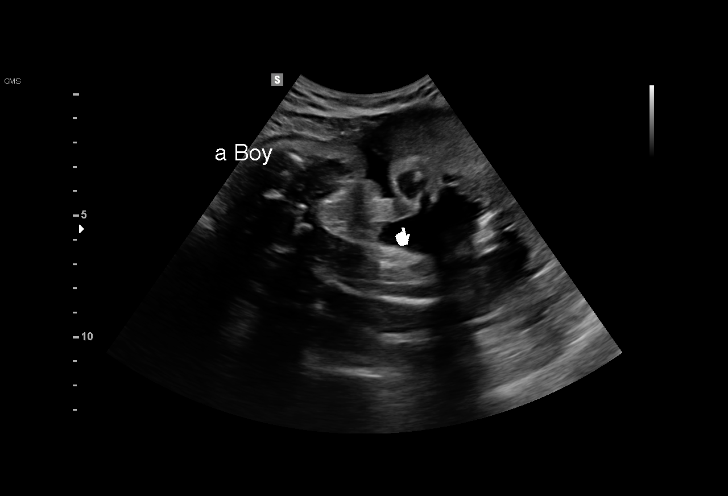
[im 18/53]
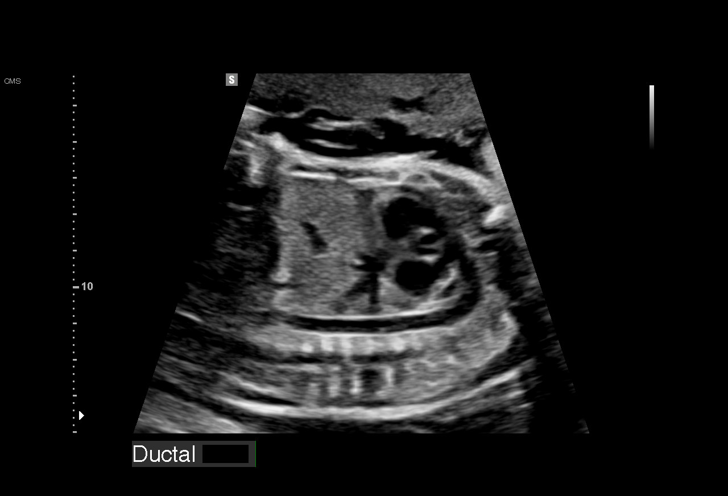
[im 22/53]
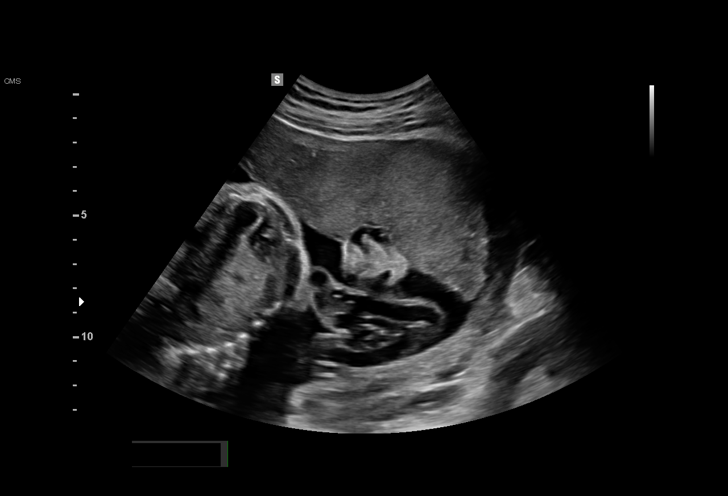
[im 27/53]
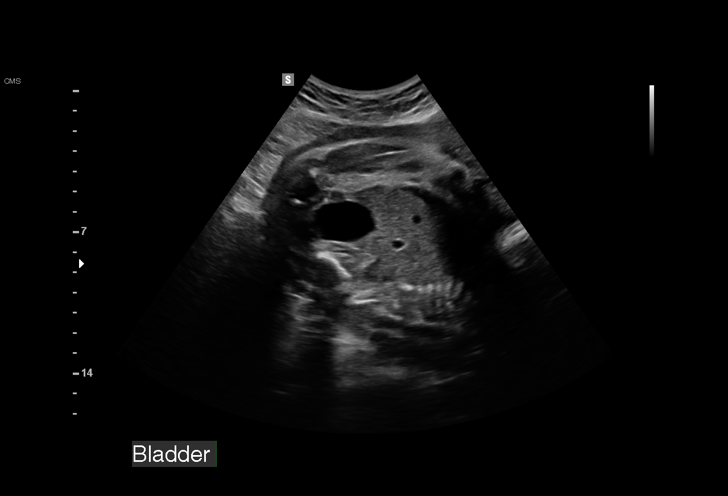
[im 31/53]
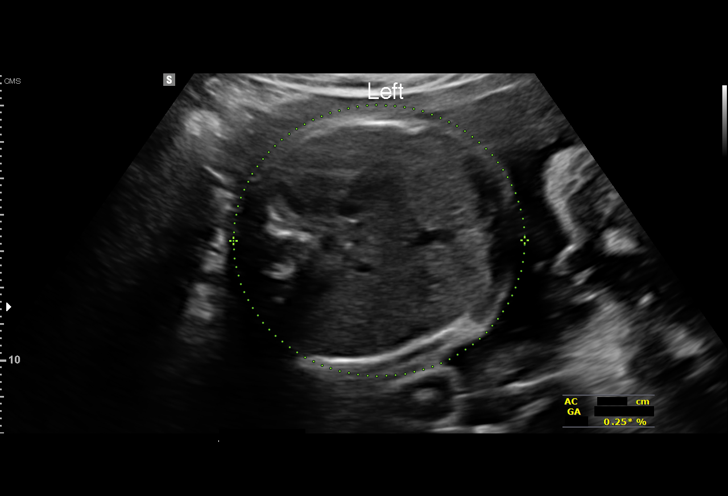
[im 35/53]
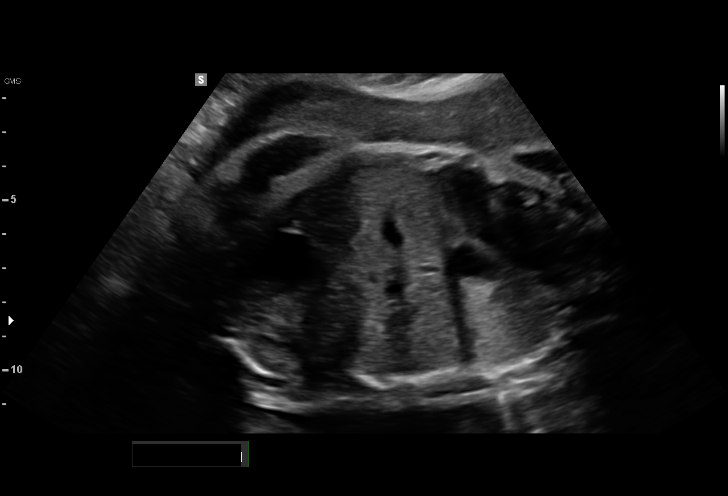
[im 39/53]
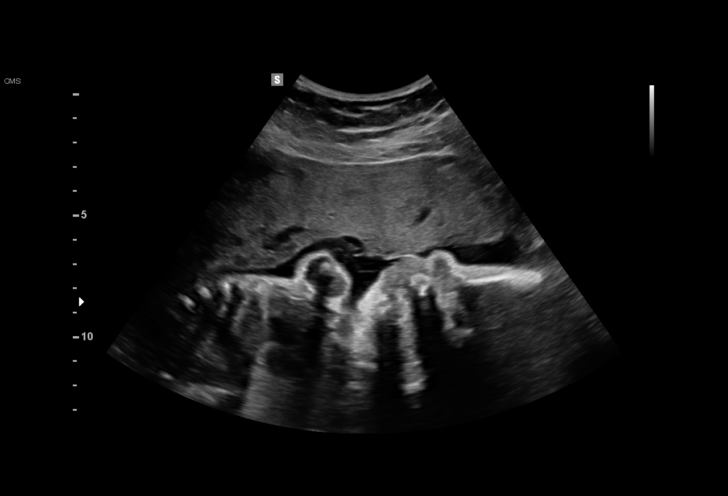
[im 43/53]
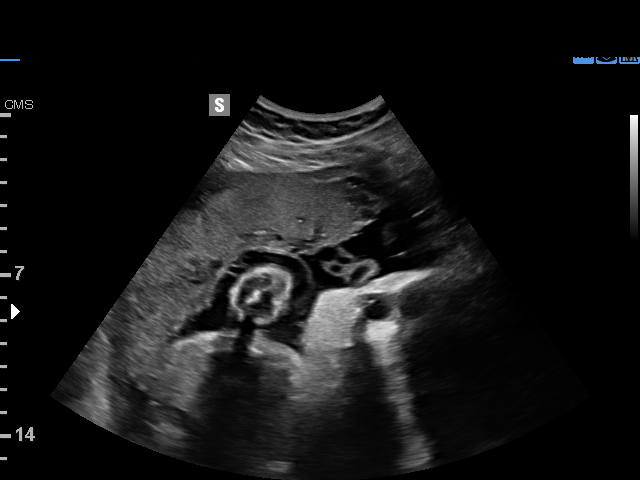
[im 47/53]
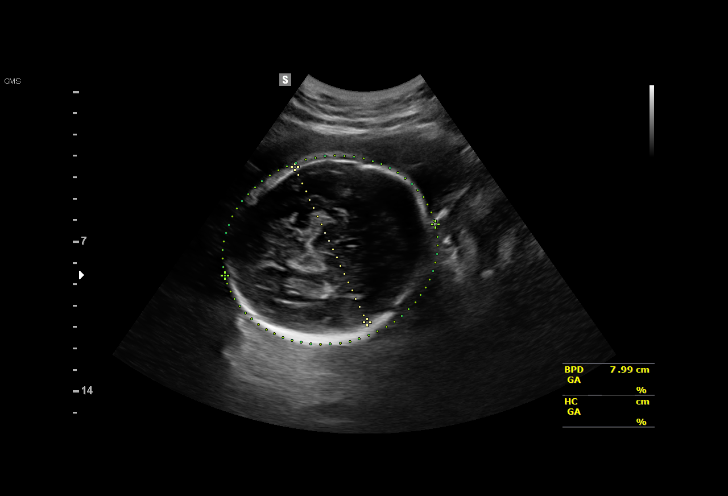
[im 51/53]
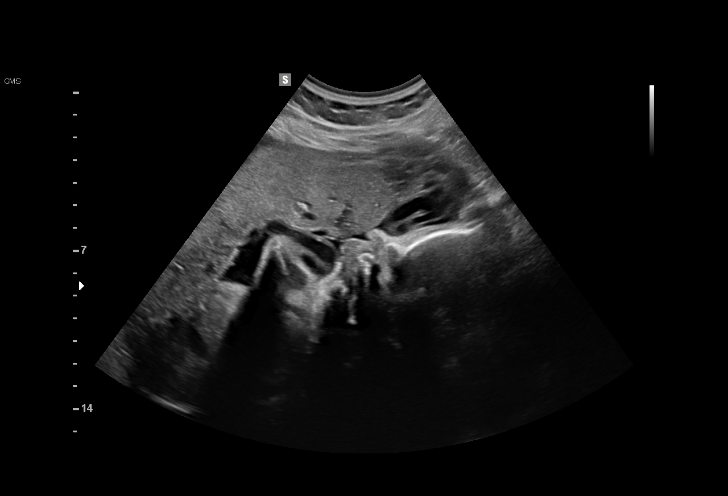

[13 of 28 positions shown; findings below may reference images not displayed]

am)


1  ISTANA MORIS            [id number]      [id number]     [id number]
2  ISTANA MORIS            [id number]      [id number]     [id number]
3  ISTANA MORIS            [id number]      [id number]     [id number]
Indications

32 weeks gestation of pregnancy
Advanced maternal age multigravida 35+,
third trimester - declined testing
Poor obstetric history: Previous fetal growth
restriction (FGR)
Maternal care for known or suspected poor
fetal growth, third trimester, not applicable or
unspecified; small AC
OB History

Gravidity:    3         Term:   1        Prem:   1
Living:       2
Fetal Evaluation

Num Of Fetuses:     1
Fetal Heart         149
Rate(bpm):
Cardiac Activity:   Observed
Presentation:       Cephalic
Placenta:           Anterior, above cervical os
P. Cord Insertion:  Visualized, central

Amniotic Fluid
AFI FV:      Subjectively within normal limits

AFI Sum(cm)     %Tile       Largest Pocket(cm)
9.41            12
RUQ(cm)       RLQ(cm)       LUQ(cm)        LLQ(cm)
1.43
Biophysical Evaluation

Amniotic F.V:   Within normal limits       F. Tone:        Observed
F. Movement:    Observed                   Score:          [DATE]
F. Breathing:   Observed
Biometry

BPD:      79.8  mm     G. Age:  32w 0d         38  %    CI:        72.88   %   70 - 86
FL/HC:      18.6   %   19.1 -
HC:      297.2  mm     G. Age:  32w 6d         32  %    HC/AC:      1.23       0.96 -
AC:      242.5  mm     G. Age:  28w 4d        < 3  %    FL/BPD:     69.2   %   71 - 87
FL:       55.2  mm     G. Age:  29w 1d        < 3  %    FL/AC:      22.8   %   20 - 24
HUM:      52.2  mm     G. Age:  30w 3d         17  %

Est. FW:    0971  gm      3 lb 1 oz     13  %
Gestational Age

LMP:           35w 1d       Date:   03/03/15                 EDD:   12/08/15
U/S Today:     30w 5d                                        EDD:   01/08/16
Best:          32w 1d    Det. By:   U/S  (08/05/15)          EDD:   12/29/15
Anatomy

Cranium:               Appears normal         Aortic Arch:            Appears normal
Cavum:                 Previously seen        Ductal Arch:            Appears normal
Ventricles:            Appears normal         Diaphragm:              Appears normal
Choroid Plexus:        Appears normal         Stomach:                Appears normal, left
sided
Cerebellum:            Previously seen        Abdomen:                Appears normal
Posterior Fossa:       Previously seen        Abdominal Wall:         Previously seen
Nuchal Fold:           Not applicable (>20    Cord Vessels:           Appears normal (3
wks GA)                                        vessel cord)
Face:                  Appears normal         Kidneys:                Appear normal
(orbits and profile)
Lips:                  Appears normal         Bladder:                Appears normal
Thoracic:              Appears normal         Spine:                  Previously seen
Heart:                 Appears normal         Upper Extremities:      Previously seen
(4CH, axis, and
situs)
RVOT:                  Appears normal         Lower Extremities:      Previously seen
LVOT:                  Appears normal

Other:  Fetus appears to be a male. Heels previously seen. Technically
difficult due to fetal position.
Doppler - Fetal Vessels

Umbilical Artery
S/D     %tile     RI              PI              PSV    ADFV    RDFV
(cm/s)
5.13    > 97.5  0.79             1.37             38.06      No      No

Cervix Uterus Adnexa
Cervix
Not visualized (advanced GA >94wks)

Uterus
No abnormality visualized.

Left Ovary
Not visualized.

Right Ovary
Not visualized.

Cul De Sac:   No free fluid seen.

Adnexa:       No abnormality visualized.
Impression

Single IUP at 32w 1d
Follow up due to suspected IUGR
The estimated fetal weight today is at the 13th %tile.  The AC
measures < 3rd %tile. (
 275 g over last 3 weeks)
BPP [DATE]
UA Doppler studies: elevated S/D ratio, but no AEDF or
REDF noted
Normal amniotic fluid volume
Recommendations

Continue weekly BPPs with UA Doppler studies
Ultrasound for growth in 3 weeks

## 2017-10-02 ENCOUNTER — Emergency Department
Admission: EM | Admit: 2017-10-02 | Discharge: 2017-10-02 | Disposition: A | Discharge status: Home / Self Care | Payer: Self-pay | Attending: Emergency Medicine | Admitting: Emergency Medicine

## 2017-10-02 ENCOUNTER — Other Ambulatory Visit: Payer: Self-pay

## 2017-10-02 ENCOUNTER — Emergency Department: Payer: Self-pay

## 2017-10-02 DIAGNOSIS — F17210 Nicotine dependence, cigarettes, uncomplicated: Secondary | ICD-10-CM | POA: Insufficient documentation

## 2017-10-02 DIAGNOSIS — Z79899 Other long term (current) drug therapy: Secondary | ICD-10-CM | POA: Insufficient documentation

## 2017-10-02 DIAGNOSIS — I10 Essential (primary) hypertension: Secondary | ICD-10-CM | POA: Insufficient documentation

## 2017-10-02 DIAGNOSIS — R079 Chest pain, unspecified: Secondary | ICD-10-CM | POA: Insufficient documentation

## 2017-10-02 LAB — BASIC METABOLIC PANEL
Anion gap: 9 (ref 5–15)
BUN: 12 mg/dL (ref 6–20)
CALCIUM: 9.7 mg/dL (ref 8.9–10.3)
CO2: 27 mmol/L (ref 22–32)
Chloride: 104 mmol/L (ref 98–111)
Creatinine, Ser: 0.75 mg/dL (ref 0.44–1.00)
GFR calc Af Amer: >60 mL/min (ref >60)
GFR calc non Af Amer: >60 mL/min (ref >60)
GLUCOSE: 92 mg/dL (ref 70–99)
Potassium: 4 mmol/L (ref 3.5–5.1)
Sodium: 140 mmol/L (ref 135–145)

## 2017-10-02 LAB — CBC
HCT: 38.8 % (ref 35.0–47.0)
Hemoglobin: 13.2 g/dL (ref 12.0–16.0)
MCH: 30.8 pg (ref 26.0–34.0)
MCHC: 34.1 g/dL (ref 32.0–36.0)
MCV: 90.5 fL (ref 80.0–100.0)
PLATELETS: 292 10*3/uL (ref 150–440)
RBC: 4.28 MIL/uL (ref 3.80–5.20)
RDW: 13.2 % (ref 11.5–14.5)
WBC: 9.9 10*3/uL (ref 3.6–11.0)

## 2017-10-02 LAB — TROPONIN I: Troponin I: <0.03 ng/mL (ref <0.03)

## 2017-10-02 MED ORDER — ASPIRIN 81 MG PO CHEW
324.0000 mg | CHEWABLE_TABLET | Freq: Once | ORAL | Status: AC
  Administered 2017-10-02: 324 mg via ORAL
  Filled 2017-10-02: qty 4

## 2017-10-02 NOTE — ED Notes (Signed)
Pt left without discharge inst.

## 2017-10-02 NOTE — ED Provider Notes (Signed)
[facility] Emergency Department Provider Note   ____________________________________________   First MD Initiated Contact with Patient 10/02/17 1912     (approximate)  I have reviewed the triage vital signs and the nursing notes.   HISTORY  Chief Complaint Chest Pain   HPI Kara Brooks is a 37 y.o. female presents for evaluation of chest discomfort  Patient reports that when she was at work this morning sometime around 9 or 10 AM she been expensing what feels like a "poking" feeling in her right upper chest.  It comes and goes last about a minute and feels like a twig poking her in the chest that she describes it.  No shortness of breath.  No fevers or chills.  She does report she felt like she is having a panic attack earlier, that made her feel little bit short of breath but she is improved now.  Denies personal history of cardiac disease.  Does have a history of preeclampsia and tubal ligation, but denies history of heart problems.  No nausea or vomiting.  No sweats.  No recent trips or travel.  Does not take any birth control.  She does smoke about 1 to 2 cigarettes daily.  No history of any blood clots.  No leg swelling.  Reports her symptoms is mild to moderate, a "poking" feeling in the right chest.  She did have a feeling of a slight tingly or strange sensation in her right arm earlier.  No weakness or numbness in the arm however no pain in the back.  No moving pain.  Does take blood pressure medicine     Past Medical History:  Diagnosis Date  . Endometriosis   . History of hidradenitis suppurativa 2016  . Hypertension     Patient Active Problem List   Diagnosis Date Noted  . Status post postpartum tubal ligation   . Chronic hypertension 11/18/2015  . IUGR (intrauterine growth restriction) affecting care of mother 11/11/2015  . Preeclampsia, severe 11/10/2015  . Gestational diabetes mellitus (GDM) in third trimester 10/14/2015  .  Anemia affecting pregnancy 07/27/2015  . Supervision of high risk pregnancy in third trimester 05/17/2015  . AMA (advanced maternal age) multigravida 35+ 05/17/2015    Past Surgical History:  Procedure Laterality Date  . Laprascopy  2007  . Sweat gland removal  2016  . TUBAL LIGATION Bilateral 11/20/2015   Procedure: POST PARTUM TUBAL LIGATION;  Surgeon: Tereso Newcomer, MD;  Location: [facility];  Service: Gynecology;  Laterality: Bilateral;    Prior to Admission medications   Medication Sig Start Date End Date Taking? Authorizing Provider  calcium carbonate (OS-CAL) 1250 (500 Ca) MG chewable tablet Chew 1 tablet by mouth daily.    [provider]  clindamycin (CLEOCIN) 150 MG capsule Take 1 capsule (150 mg total) by mouth 4 (four) times daily. 01/23/17   Joni Reining, PA-C  clindamycin (CLEOCIN) 300 MG capsule Take 1 capsule (300 mg total) by mouth 3 (three) times daily. 12/15/15   Irean Hong, MD  docusate sodium (COLACE) 100 MG capsule Take 1 capsule (100 mg total) by mouth 2 (two) times daily as needed. Patient not taking: Reported on 12/23/2015 07/27/15   Clemmons, Elmore Guise, CNM  ferrous sulfate (FERROUSUL) 325 (65 FE) MG tablet Take 1 tablet (325 mg total) by mouth 2 (two) times daily. Patient not taking: Reported on 12/23/2015 07/27/15   Clemmons, Elmore Guise, CNM  hydrochlorothiazide (HYDRODIURIL) 25 MG tablet Take 1 tablet (25  mg total) by mouth daily. 11/22/15   Willodean Rosenthal, MD  HYDROcodone-acetaminophen (NORCO) 5-325 MG tablet Take 1 tablet by mouth every 6 (six) hours as needed for moderate pain. Patient not taking: Reported on 12/23/2015 12/15/15   Irean Hong, MD  ibuprofen (ADVIL,MOTRIN) 600 MG tablet Take 1 tablet (600 mg total) by mouth every 8 (eight) hours as needed. 01/23/17   Joni Reining, PA-C  ibuprofen (ADVIL,MOTRIN) 800 MG tablet Take 1 tablet (800 mg total) by mouth every 8 (eight) hours as needed for moderate pain. 12/15/15   Irean Hong, MD  Multiple  Vitamins-Minerals (MULTIVITAMIN WITH MINERALS) tablet Take 1 tablet by mouth daily.    [provider]  sertraline (ZOLOFT) 100 MG tablet Take 1 tablet (100 mg total) by mouth daily. 12/23/15   Constant, Peggy, MD  traMADol (ULTRAM) 50 MG tablet Take 1 tablet (50 mg total) by mouth every 6 (six) hours as needed. 01/23/17 01/23/18  Joni Reining, PA-C  zolpidem (AMBIEN) 10 MG tablet Take 1 tablet (10 mg total) by mouth at bedtime as needed for sleep. 12/23/15 01/22/16  Constant, Peggy, MD    Allergies Flagyl [metronidazole] and Sulfa antibiotics  Family History  Problem Relation Age of Onset  . Hypertension Mother   . Diabetes Mother   . Depression Mother   No strong family history of early heart disease  Social History Social History   Tobacco Use  . Smoking status: Current Some Day Smoker    Packs/day: 0.25    Years: 15.00    Pack years: 3.75    Types: Cigarettes  . Smokeless tobacco: Never Used  Substance Use Topics  . Alcohol use: No  . Drug use: No    Review of Systems Constitutional: No fever/chills Eyes: No visual changes. ENT: No sore throat. Cardiovascular: See HPI Respiratory: Denies shortness of breath. Gastrointestinal: No abdominal pain.  No nausea, no vomiting.  No diarrhea.  No constipation. Genitourinary: Negative for dysuria.  Denies pregnancy. Musculoskeletal: Negative for back pain. Skin: Negative for rash. Neurological: Negative for headaches, focal weakness or numbness.    ____________________________________________   PHYSICAL EXAM:  VITAL SIGNS: ED Triage Vitals  Enc Vitals Group     BP 10/02/17 1756 (!) 159/88     Pulse Rate 10/02/17 1756 79     Resp 10/02/17 1756 16     Temp 10/02/17 1756 98.6 F (37 C)     Temp Source 10/02/17 1756 Oral     SpO2 10/02/17 1756 100 %     Weight 10/02/17 1754 175 lb (79.4 kg)     Height 10/02/17 1754 5\' 6"  (1.676 m)     Head Circumference --      Peak Flow --      Pain Score 10/02/17 1754  8     Pain Loc --      Pain Edu? --      Excl. in GC? --     Constitutional: Alert and oriented. Well appearing and in no acute distress. Eyes: Conjunctivae are normal. Head: Atraumatic. Nose: No congestion/rhinnorhea. Mouth/Throat: Mucous membranes are moist. Neck: No stridor.   Cardiovascular: Normal rate, regular rhythm. Grossly normal heart sounds.  Good peripheral circulation. Respiratory: Normal respiratory effort.  No retractions. Lungs CTAB. Gastrointestinal: Soft and nontender. No distention. Musculoskeletal: No lower extremity tenderness nor edema. Neurologic:  Normal speech and language. No gross focal neurologic deficits are appreciated.  Skin:  Skin is warm, dry and intact. No rash noted. Psychiatric: Mood  and affect are normal. Speech and behavior are normal.  ____________________________________________   LABS (all labs ordered are listed, but only abnormal results are displayed)  Labs Reviewed  BASIC METABOLIC PANEL  CBC  TROPONIN I  TROPONIN I  POC URINE PREG, ED   ____________________________________________  EKG  Reviewed enterotomy at 1800 Heart rate 79 QRS 89 QTC 430 Normal sinus rhythm, slight nonspecific T wave abnormality in V3 through V5, very nonspecific.  No obvious acute ischemic changes ____________________________________________  RADIOLOGY  Chest x-ray reviewed negative for acute  ____________________________________________   PROCEDURES  Procedure(s) performed: None  Procedures  Critical Care performed: No  ____________________________________________   INITIAL IMPRESSION / ASSESSMENT AND PLAN / ED COURSE  Pertinent labs & imaging results that were available during my care of the patient were reviewed by me and considered in my medical decision making (see chart for details).  Differential diagnosis includes, but is not limited to, ACS, aortic dissection, pulmonary embolism, cardiac tamponade, pneumothorax, pneumonia,  pericarditis, myocarditis, GI-related causes including esophagitis/gastritis, and musculoskeletal chest wall pain.  No ripping tearing or moving pain.  No symptoms suggest dissection.  No back pain.  Low risk for pulmonary embolism, PERC negative.  Heart score indicates low risk, symptoms atypical acute coronary syndrome.  First troponin and EKG reassuring.      Pulmonary Embolism Rule-out Criteria (PERC rule)                        If YES to ANY of the following, the PERC rule is not satisfied and cannot be used to rule out PE in this patient (consider d-dimer or imaging depending on pre-test probability).                      If NO to ALL of the following, AND the clinician's pre-test probability is <15%, the [facility] rule is satisfied and there is no need for further workup (including no need to obtain a d-dimer) as the post-test probability of pulmonary embolism is <2%.                      Mnemonic is HAD CLOTS   H - hormone use (exogenous estrogen)      No. A - age > 50                                                 No. D - DVT/PE history                                      No.   C - coughing blood (hemoptysis)                 No. L - leg swelling, unilateral                             No. O - O2 Sat on Room Air < 95%                  No. T - tachycardia (HR ? 100)  No. S - surgery or trauma, recent                      No.   Based on my evaluation of the patient, including application of this decision instrument, further testing to evaluate for pulmonary embolism is not indicated at this time.  ----------------------------------------- 8:17 PM on 10/02/2017 -----------------------------------------   Patient resting comfortably in no acute distress.  Ongoing care assigned to Dr. Sharma CovertNorman, follow-up on repeat troponin, if normal anticipate discharge with close cardiology follow-up and return precautions regarding chest discomfort.        ____________________________________________   FINAL CLINICAL IMPRESSION(S) / ED DIAGNOSES  Final diagnoses:  Chest pain with low risk for cardiac etiology      NEW MEDICATIONS STARTED DURING THIS VISIT:  New Prescriptions   No medications on file     Note:  This document was prepared using Dragon voice recognition software and may include unintentional dictation errors.     Sharyn CreamerQuale, Tesslyn Baumert, MD 10/02/17 2053

## 2017-10-02 NOTE — Discharge Instructions (Signed)

## 2017-10-02 NOTE — ED Notes (Signed)
Labs sent.  No chest pain or sob.  nsr on monitor.  Pt alert.  No acute distress.

## 2017-10-02 NOTE — ED Notes (Signed)
Pt reports chest pain and sob for 1 day.  No n/v/d.  Pt states I feel like it may be anxiety.  Pain intermittent.  cig smoker   No cough.  No fever.  Pt alert.  nsr on monitor.  Skin warm and dry.

## 2017-10-02 NOTE — ED Triage Notes (Signed)
Pt states central CP that began today while at work. Denies radiation. States R arm "feels really weird." has HTN. Denies hx of cardiac issues.   Alert, oriented, ambulatory.

## 2017-10-02 NOTE — ED Notes (Signed)
Tried to call pt about discharge inst.  No answer.  md aware.

## 2018-01-13 ENCOUNTER — Emergency Department
Admission: EM | Admit: 2018-01-13 | Discharge: 2018-01-13 | Disposition: A | Discharge status: Home / Self Care | Payer: Managed Care, Other (non HMO) | Attending: Emergency Medicine | Admitting: Emergency Medicine

## 2018-01-13 ENCOUNTER — Encounter: Payer: Self-pay | Admitting: Emergency Medicine

## 2018-01-13 ENCOUNTER — Other Ambulatory Visit: Payer: Self-pay

## 2018-01-13 DIAGNOSIS — F17210 Nicotine dependence, cigarettes, uncomplicated: Secondary | ICD-10-CM | POA: Diagnosis not present

## 2018-01-13 DIAGNOSIS — L732 Hidradenitis suppurativa: Secondary | ICD-10-CM

## 2018-01-13 DIAGNOSIS — Z79899 Other long term (current) drug therapy: Secondary | ICD-10-CM | POA: Diagnosis not present

## 2018-01-13 DIAGNOSIS — I10 Essential (primary) hypertension: Secondary | ICD-10-CM | POA: Insufficient documentation

## 2018-01-13 DIAGNOSIS — L989 Disorder of the skin and subcutaneous tissue, unspecified: Secondary | ICD-10-CM | POA: Diagnosis present

## 2018-01-13 MED ORDER — CLINDAMYCIN HCL 300 MG PO CAPS: 300.0000 mg | ORAL_CAPSULE | Freq: Three times a day (TID) | ORAL | 0 refills | Status: AC

## 2018-01-13 MED ORDER — OXYCODONE-ACETAMINOPHEN 5-325 MG PO TABS
1.0000 | ORAL_TABLET | Freq: Once | ORAL | Status: AC
  Administered 2018-01-13: 1 via ORAL
  Filled 2018-01-13: qty 1

## 2018-01-13 MED ORDER — CLINDAMYCIN PHOSPHATE 300 MG/2ML IJ SOLN
INTRAMUSCULAR | Status: AC
  Filled 2018-01-13: qty 4

## 2018-01-13 MED ORDER — TRAMADOL HCL 50 MG PO TABS: 50.0000 mg | ORAL_TABLET | Freq: Four times a day (QID) | ORAL | 0 refills | Status: AC | PRN

## 2018-01-13 MED ORDER — CLINDAMYCIN PHOSPHATE 600 MG/4ML IJ SOLN
600.0000 mg | Freq: Once | INTRAMUSCULAR | Status: AC
  Administered 2018-01-13: 600 mg via INTRAMUSCULAR
  Filled 2018-01-13: qty 4

## 2018-01-13 NOTE — ED Provider Notes (Signed)
[facility] Emergency Department Provider Note  ____________________________________________  Time seen: Approximately 8:43 AM  I have reviewed the triage vital signs and the nursing notes.   HISTORY  Chief Complaint Abscess    HPI Kara Brooks is a 37 y.o. female that presents to the emergency department for abscess to left axilla for 3 days. She has a history of hidradenitis and had surgery to the right axilla.  She says that drainage has been attempted several times to area without success.  No fever, chills.   Past Medical History:  Diagnosis Date  . Endometriosis   . History of hidradenitis suppurativa 2016  . Hypertension     Patient Active Problem List   Diagnosis Date Noted  . Status post postpartum tubal ligation   . Chronic hypertension 11/18/2015  . IUGR (intrauterine growth restriction) affecting care of mother 11/11/2015  . Preeclampsia, severe 11/10/2015  . Gestational diabetes mellitus (GDM) in third trimester 10/14/2015  . Anemia affecting pregnancy 07/27/2015  . Supervision of high risk pregnancy in third trimester 05/17/2015  . AMA (advanced maternal age) multigravida 35+ 05/17/2015    Past Surgical History:  Procedure Laterality Date  . Laprascopy  2007  . Sweat gland removal  2016  . TUBAL LIGATION Bilateral 11/20/2015   Procedure: POST PARTUM TUBAL LIGATION;  Surgeon: Tereso Newcomer, MD;  Location: [facility];  Service: Gynecology;  Laterality: Bilateral;    Prior to Admission medications   Medication Sig Start Date End Date Taking? Authorizing Provider  escitalopram (LEXAPRO) 20 MG tablet Take 20 mg by mouth daily.   Yes [provider]  lisinopril-hydrochlorothiazide (PRINZIDE,ZESTORETIC) 20-12.5 MG tablet Take 1 tablet by mouth daily.   Yes [provider]  calcium carbonate (OS-CAL) 1250 (500 Ca) MG chewable tablet Chew 1 tablet by mouth daily.    [provider]  clindamycin (CLEOCIN) 300  MG capsule Take 1 capsule (300 mg total) by mouth 3 (three) times daily for 10 days. 01/13/18 01/23/18  Enid Derry, PA-C  docusate sodium (COLACE) 100 MG capsule Take 1 capsule (100 mg total) by mouth 2 (two) times daily as needed. Patient not taking: Reported on 12/23/2015 07/27/15   Clemmons, Elmore Guise, CNM  ferrous sulfate (FERROUSUL) 325 (65 FE) MG tablet Take 1 tablet (325 mg total) by mouth 2 (two) times daily. Patient not taking: Reported on 12/23/2015 07/27/15   Clemmons, Elmore Guise, CNM  hydrochlorothiazide (HYDRODIURIL) 25 MG tablet Take 1 tablet (25 mg total) by mouth daily. 11/22/15   Willodean Rosenthal, MD  HYDROcodone-acetaminophen (NORCO) 5-325 MG tablet Take 1 tablet by mouth every 6 (six) hours as needed for moderate pain. Patient not taking: Reported on 12/23/2015 12/15/15   Irean Hong, MD  ibuprofen (ADVIL,MOTRIN) 600 MG tablet Take 1 tablet (600 mg total) by mouth every 8 (eight) hours as needed. 01/23/17   Joni Reining, PA-C  ibuprofen (ADVIL,MOTRIN) 800 MG tablet Take 1 tablet (800 mg total) by mouth every 8 (eight) hours as needed for moderate pain. 12/15/15   Irean Hong, MD  Multiple Vitamins-Minerals (MULTIVITAMIN WITH MINERALS) tablet Take 1 tablet by mouth daily.    [provider]  sertraline (ZOLOFT) 100 MG tablet Take 1 tablet (100 mg total) by mouth daily. 12/23/15   Constant, Peggy, MD  traMADol (ULTRAM) 50 MG tablet Take 1 tablet (50 mg total) by mouth every 6 (six) hours as needed. 01/13/18 01/13/19  Enid Derry, PA-C  zolpidem (AMBIEN) 10 MG tablet Take 1  tablet (10 mg total) by mouth at bedtime as needed for sleep. 12/23/15 01/22/16  Constant, Peggy, MD    Allergies Flagyl [metronidazole] and Sulfa antibiotics  Family History  Problem Relation Age of Onset  . Hypertension Mother   . Diabetes Mother   . Depression Mother     Social History Social History   Tobacco Use  . Smoking status: Current Some Day Smoker    Packs/day: 0.25    Years: 15.00     Pack years: 3.75    Types: Cigarettes  . Smokeless tobacco: Never Used  Substance Use Topics  . Alcohol use: No  . Drug use: No     Review of Systems  Constitutional: No fever/chills Cardiovascular: No chest pain. Respiratory: No SOB. Gastrointestinal: No abdominal pain.  No nausea, no vomiting.  Musculoskeletal: Negative for musculoskeletal pain. Skin: Negative for rash, abrasions, lacerations, ecchymosis.   ____________________________________________   PHYSICAL EXAM:  VITAL SIGNS: ED Triage Vitals  Enc Vitals Group     BP 01/13/18 0739 (!) 143/86     Pulse Rate 01/13/18 0739 (!) 109     Resp 01/13/18 0739 18     Temp 01/13/18 0739 98.5 F (36.9 C)     Temp Source 01/13/18 0739 Oral     SpO2 01/13/18 0739 100 %     Weight 01/13/18 0740 175 lb (79.4 kg)     Height 01/13/18 0740 5\' 6"  (1.676 m)     Head Circumference --      Peak Flow --      Pain Score 01/13/18 0740 10     Pain Loc --      Pain Edu? --      Excl. in GC? --      Constitutional: Alert and oriented. Well appearing and in no acute distress. Eyes: Conjunctivae are normal. PERRL. EOMI. Head: Atraumatic. ENT:      Ears:      Nose: No congestion/rhinnorhea.      Mouth/Throat: Mucous membranes are moist.  Neck: No stridor.  Cardiovascular: Normal rate, regular rhythm.  Good peripheral circulation. Respiratory: Normal respiratory effort without tachypnea or retractions. Lungs CTAB. Good air entry to the bases with no decreased or absent breath sounds. Musculoskeletal: Full range of motion to all extremities. No gross deformities appreciated.  Tender to palpation in left axilla.  No palpable abscess. Neurologic:  Normal speech and language. No gross focal neurologic deficits are appreciated.  Skin:  Skin is warm, dry and intact. No rash noted. Psychiatric: Mood and affect are normal. Speech and behavior are normal. Patient exhibits appropriate insight and  judgement.   ____________________________________________   LABS (all labs ordered are listed, but only abnormal results are displayed)  Labs Reviewed - No data to display ____________________________________________  EKG   ____________________________________________  RADIOLOGY  No results found.  ____________________________________________    PROCEDURES  Procedure(s) performed:    Procedures    Medications  oxyCODONE-acetaminophen (PERCOCET/ROXICET) 5-325 MG per tablet 1 tablet (1 tablet Oral Given 01/13/18 0941)  clindamycin (CLEOCIN) injection 600 mg (600 mg Intramuscular Given 01/13/18 0942)     ____________________________________________   INITIAL IMPRESSION / ASSESSMENT AND PLAN / ED COURSE  Pertinent labs & imaging results that were available during my care of the patient were reviewed by me and considered in my medical decision making (see chart for details).  Review of the [place] CSRS was performed in accordance of the [facility] prior to dispensing any controlled drugs.     Patient's diagnosis  is consistent with hidradenitis.  Vital signs and exam are reassuring.  I do not palpate anything that is drainable at this time.  IM clindamycin was given.  Patient will be discharged home with prescriptions for clindamycin and tramadol. Patient is to follow up with general surgery as directed. Patient is given ED precautions to return to the ED for any worsening or new symptoms.     ____________________________________________  FINAL CLINICAL IMPRESSION(S) / ED DIAGNOSES  Final diagnoses:  Hidradenitis      NEW MEDICATIONS STARTED DURING THIS VISIT:  ED Discharge Orders         Ordered    clindamycin (CLEOCIN) 300 MG capsule  3 times daily     01/13/18 0925    traMADol (ULTRAM) 50 MG tablet  Every 6 hours PRN     01/13/18 0929              This chart was dictated using voice recognition software/Dragon. Despite best efforts to proofread,  errors can occur which can change the meaning. Any change was purely unintentional.    Enid Derry, PA-C 01/13/18 1539    Jene Every, MD 01/14/18 [phone]

## 2018-01-13 NOTE — ED Triage Notes (Signed)
Abscess L axilla x 3 days.  

## 2018-01-13 NOTE — ED Notes (Signed)
See triage note  Presents with possible abscess under left arm  States she noticed area 3 days ago  Hx of same no fever

## 2019-02-25 ENCOUNTER — Other Ambulatory Visit: Payer: Self-pay

## 2019-02-25 ENCOUNTER — Encounter: Payer: Self-pay | Admitting: *Deleted

## 2019-02-25 DIAGNOSIS — Z5321 Procedure and treatment not carried out due to patient leaving prior to being seen by health care provider: Secondary | ICD-10-CM | POA: Diagnosis not present

## 2019-02-25 DIAGNOSIS — R200 Anesthesia of skin: Secondary | ICD-10-CM | POA: Insufficient documentation

## 2019-02-25 DIAGNOSIS — R0609 Other forms of dyspnea: Secondary | ICD-10-CM | POA: Diagnosis not present

## 2019-02-25 DIAGNOSIS — R000 Tachycardia, unspecified: Secondary | ICD-10-CM | POA: Diagnosis present

## 2019-02-25 LAB — BASIC METABOLIC PANEL
Anion gap: 10 (ref 5–15)
BUN: 11 mg/dL (ref 6–20)
CO2: 27 mmol/L (ref 22–32)
Calcium: 9.7 mg/dL (ref 8.9–10.3)
Chloride: 104 mmol/L (ref 98–111)
Creatinine, Ser: 0.74 mg/dL (ref 0.44–1.00)
GFR calc Af Amer: >60 mL/min (ref >60)
GFR calc non Af Amer: >60 mL/min (ref >60)
Glucose, Bld: 149 mg/dL — ABNORMAL HIGH (ref 70–99)
Potassium: 3.6 mmol/L (ref 3.5–5.1)
Sodium: 141 mmol/L (ref 135–145)

## 2019-02-25 LAB — CBC
HCT: 37.7 % (ref 36.0–46.0)
Hemoglobin: 12.3 g/dL (ref 12.0–15.0)
MCH: 30.1 pg (ref 26.0–34.0)
MCHC: 32.6 g/dL (ref 30.0–36.0)
MCV: 92.2 fL (ref 80.0–100.0)
Platelets: 273 10*3/uL (ref 150–400)
RBC: 4.09 MIL/uL (ref 3.87–5.11)
RDW: 12.1 % (ref 11.5–15.5)
WBC: 11.2 10*3/uL — ABNORMAL HIGH (ref 4.0–10.5)
nRBC: 0 % (ref 0.0–0.2)

## 2019-02-25 MED ORDER — SODIUM CHLORIDE 0.9% FLUSH: 3.0000 mL | Freq: Once | INTRAVENOUS | Status: DC

## 2019-02-25 NOTE — ED Triage Notes (Signed)
Pt said she was talking to her sister, she said she felt like her heart was racing, numbness in her hands and feet, and had a hard time breathing. Symptoms lasting approx 20 minutes. She says she feels lightheaded and has a headache. Mae x4, clear speech.

## 2019-02-26 ENCOUNTER — Emergency Department
Admission: EM | Admit: 2019-02-26 | Discharge: 2019-02-26 | Disposition: A | Discharge status: Home / Self Care | Payer: Managed Care, Other (non HMO) | Attending: Emergency Medicine | Admitting: Emergency Medicine

## 2019-02-26 LAB — TROPONIN I (HIGH SENSITIVITY): Troponin I (High Sensitivity): <2 ng/L (ref <18)

## 2019-02-26 NOTE — ED Notes (Signed)
No answer when called several times from lobby 

## 2019-02-27 ENCOUNTER — Telehealth: Payer: Self-pay | Admitting: Emergency Medicine

## 2019-02-27 NOTE — Telephone Encounter (Signed)
Called patient due to lwot to inquire about condition and follow up plans.she says she did go to her pcp today.

## 2019-04-04 ENCOUNTER — Other Ambulatory Visit: Payer: Self-pay

## 2019-04-04 ENCOUNTER — Emergency Department: Payer: Managed Care, Other (non HMO)

## 2019-04-04 ENCOUNTER — Encounter: Payer: Self-pay | Admitting: Emergency Medicine

## 2019-04-04 ENCOUNTER — Emergency Department
Admission: EM | Admit: 2019-04-04 | Discharge: 2019-04-05 | Disposition: A | Discharge status: Home / Self Care | Payer: Managed Care, Other (non HMO) | Attending: Emergency Medicine | Admitting: Emergency Medicine

## 2019-04-04 DIAGNOSIS — Z5321 Procedure and treatment not carried out due to patient leaving prior to being seen by health care provider: Secondary | ICD-10-CM | POA: Diagnosis not present

## 2019-04-04 DIAGNOSIS — R29810 Facial weakness: Secondary | ICD-10-CM | POA: Insufficient documentation

## 2019-04-04 DIAGNOSIS — R531 Weakness: Secondary | ICD-10-CM | POA: Diagnosis present

## 2019-04-04 DIAGNOSIS — R1310 Dysphagia, unspecified: Secondary | ICD-10-CM | POA: Diagnosis not present

## 2019-04-04 DIAGNOSIS — R200 Anesthesia of skin: Secondary | ICD-10-CM | POA: Diagnosis not present

## 2019-04-04 LAB — CBC
HCT: 36.7 % (ref 36.0–46.0)
Hemoglobin: 12 g/dL (ref 12.0–15.0)
MCH: 29.7 pg (ref 26.0–34.0)
MCHC: 32.7 g/dL (ref 30.0–36.0)
MCV: 90.8 fL (ref 80.0–100.0)
Platelets: 275 10*3/uL (ref 150–400)
RBC: 4.04 MIL/uL (ref 3.87–5.11)
RDW: 12.2 % (ref 11.5–15.5)
WBC: 10.7 10*3/uL — ABNORMAL HIGH (ref 4.0–10.5)
nRBC: 0 % (ref 0.0–0.2)

## 2019-04-04 LAB — URINALYSIS, COMPLETE (UACMP) WITH MICROSCOPIC
Bacteria, UA: NONE SEEN
Bilirubin Urine: NEGATIVE
Glucose, UA: NEGATIVE mg/dL
Hgb urine dipstick: NEGATIVE
Ketones, ur: NEGATIVE mg/dL
Leukocytes,Ua: NEGATIVE
Nitrite: NEGATIVE
Protein, ur: NEGATIVE mg/dL
Specific Gravity, Urine: 1.029 (ref 1.005–1.030)
pH: 6 (ref 5.0–8.0)

## 2019-04-04 LAB — BASIC METABOLIC PANEL
Anion gap: 9 (ref 5–15)
BUN: 12 mg/dL (ref 6–20)
CO2: 26 mmol/L (ref 22–32)
Calcium: 9.5 mg/dL (ref 8.9–10.3)
Chloride: 104 mmol/L (ref 98–111)
Creatinine, Ser: 0.82 mg/dL (ref 0.44–1.00)
GFR calc Af Amer: >60 mL/min (ref >60)
GFR calc non Af Amer: >60 mL/min (ref >60)
Glucose, Bld: 121 mg/dL — ABNORMAL HIGH (ref 70–99)
Potassium: 3.8 mmol/L (ref 3.5–5.1)
Sodium: 139 mmol/L (ref 135–145)

## 2019-04-04 LAB — POCT PREGNANCY, URINE: Preg Test, Ur: NEGATIVE

## 2019-04-04 NOTE — ED Notes (Signed)
D/w Dr. Joan Mayans, new order received for CT head without contrast

## 2019-04-04 NOTE — ED Notes (Signed)
Spoke with [facility], they have urine in the lab.

## 2019-04-04 NOTE — ED Triage Notes (Signed)
Pt arrived via POV with reports of 3 weeks of off and on weakness on the right, facial weakness, difficulty swallowing.  Pt states today she noted her whole right side became numb this morning that lasted 5-10 minutes and resolved then happened again about 1 hour PTA lasting only 5-10 minutes.

## 2019-04-04 NOTE — ED Notes (Signed)
Called lab to check on status of results. Lab states they have blood and will run now. Lab states that they do not have urine.

## 2019-04-05 NOTE — ED Triage Notes (Signed)
Pt called from [facility] to treatment room, no response 

## 2019-04-05 NOTE — ED Notes (Signed)
Pt called from [facility] to treatment room, no response 

## 2019-06-05 ENCOUNTER — Other Ambulatory Visit: Payer: Self-pay

## 2019-06-05 ENCOUNTER — Ambulatory Visit: Payer: BC Managed Care – PPO | Attending: Neurology | Admitting: Physical Therapy

## 2019-06-05 DIAGNOSIS — M6281 Muscle weakness (generalized): Secondary | ICD-10-CM | POA: Diagnosis present

## 2019-06-05 DIAGNOSIS — R4701 Aphasia: Secondary | ICD-10-CM | POA: Diagnosis present

## 2019-06-06 NOTE — Therapy (Addendum)
[facility] [address] [place], [place], [address] Phone: [phone]   Fax:  [phone]  Physical Therapy Evaluation  Patient Details  Name: Kara Brooks MRN: [id number] Date of Birth: 1980-07-11 Referring Provider (PT): Dr. Malvin Johns   Encounter Date: 06/05/2019   PT End of Session - 06/06/19 1922    Visit Number  1    Number of Visits  1    Date for PT Re-Evaluation  06/06/19    PT Start Time  0802    PT Stop Time  0916    PT Time Calculation (min)  74 min    Activity Tolerance  Patient tolerated treatment well    Behavior During Therapy  [facility] for tasks assessed/performed       Past Medical History:  Diagnosis Date  . Endometriosis   . History of hidradenitis suppurativa 2016  . Hypertension     Past Surgical History:  Procedure Laterality Date  . Laprascopy  2007  . Sweat gland removal  2016  . TUBAL LIGATION Bilateral 11/20/2015   Procedure: POST PARTUM TUBAL LIGATION;  Surgeon: Tereso Newcomer, MD;  Location: [facility];  Service: Gynecology;  Laterality: Bilateral;    There were no vitals filed for this visit.   Subjective Assessment - 06/08/19 0854    Subjective  See FCE    Patient Stated Goals  FCE only    Currently in Pain?  No/denies            Plan - 06/08/19 0857    Clinical Impression Statement  Overall Level of Work: Falls within the Light range.  Exerting up to 20 pounds of force occasionally, and/or up to 10 pounds of force frequently, and/or a negligible amount of force constantly (Constantly: activity or condition exist 2/3 or more of the time) to move objects.  Physical demand requirements are in excess of those for Sedentary Work.  Even though the weight lifted may be only a negligible amount, a job should be rated Light Work: (1) when it requires walking or standing to significant degree; or (2) when it requires sitting most of the time but entails pushing and/or pulling of arm or leg  controls; and/or (3) when the job requires working at a production rate pace entailing the constant pushing and/or pulling of materials even though the weight of those materials is negligible.  NOTE: The constant stress and strain of maintaining a production rate pace, especially in an industrial setting, can be and is physically demanding of a worker even though the amount of force exerted is negligible.    Please see the Task Performance Table for specific abilities.  Tolerance for the 8-Hour Day: Based on the individual task scores in Dynamic Strength, Position Tolerance and Mobility, the client is able to tolerate the Light level of work for the 8-hour day/40-hour week.    Stability/Clinical Decision Making  Stable/Uncomplicated    Clinical Decision Making  Low    Rehab Potential  Good    PT Frequency  One time visit    PT Treatment/Interventions  ADLs/Self Care Home Management;Functional mobility training;Therapeutic activities;Therapeutic exercise;Neuromuscular re-education;Patient/family education    PT Next Visit Plan  FCE only (report faxed to Dr. Daisy Blossom office).       Patient will benefit from skilled therapeutic intervention in order to improve the following deficits and impairments:  Decreased strength  Visit Diagnosis: Expressive aphasia  Muscle weakness (generalized)     Problem List Patient Active Problem  List   Diagnosis Date Noted  . Status post postpartum tubal ligation   . Chronic hypertension 11/18/2015  . IUGR (intrauterine growth restriction) affecting care of mother 11/11/2015  . Preeclampsia, severe 11/10/2015  . Gestational diabetes mellitus (GDM) in third trimester 10/14/2015  . Anemia affecting pregnancy 07/27/2015  . Supervision of high risk pregnancy in third trimester 05/17/2015  . AMA (advanced maternal age) multigravida 35+ 05/17/2015   Pura Spice, PT, DPT # [id number] 06/08/2019, 9:00 AM  [facility] [address] [place], [place], [address] Phone: [phone]   Fax:  [phone]  Name: Kara Brooks MRN: [id number] Date of Birth: 1980-12-17

## 2019-06-08 NOTE — Addendum Note (Signed)
Addended by: Cammie Mcgee on: 06/08/2019 09:06 AM   Modules accepted: Orders

## 2019-06-30 ENCOUNTER — Telehealth (HOSPITAL_COMMUNITY): Payer: Self-pay | Admitting: Psychiatry

## 2019-06-30 NOTE — Telephone Encounter (Signed)
D:  Tamara (referral coordinator) referred pt to [facility].  A:  Placed call to orient pt, but there was no answer.  Left vm requesting pt to contact case manager.  Informed Delaney Meigs.

## 2019-08-17 ENCOUNTER — Ambulatory Visit: Payer: BC Managed Care – PPO | Admitting: Speech Pathology

## 2019-08-19 ENCOUNTER — Ambulatory Visit: Payer: BC Managed Care – PPO | Admitting: Speech Pathology

## 2019-08-27 ENCOUNTER — Encounter: Payer: BC Managed Care – PPO | Admitting: Speech Pathology

## 2019-08-31 ENCOUNTER — Encounter: Payer: BC Managed Care – PPO | Admitting: Speech Pathology

## 2019-09-02 ENCOUNTER — Encounter: Payer: BC Managed Care – PPO | Admitting: Speech Pathology

## 2019-09-03 ENCOUNTER — Encounter (HOSPITAL_COMMUNITY): Payer: Self-pay | Admitting: *Deleted

## 2019-09-03 ENCOUNTER — Other Ambulatory Visit: Payer: Self-pay

## 2019-09-03 ENCOUNTER — Emergency Department (HOSPITAL_COMMUNITY)
Admission: EM | Admit: 2019-09-03 | Discharge: 2019-09-03 | Disposition: A | Discharge status: Home / Self Care | Payer: BC Managed Care – PPO | Attending: Emergency Medicine | Admitting: Emergency Medicine

## 2019-09-03 DIAGNOSIS — Z79899 Other long term (current) drug therapy: Secondary | ICD-10-CM | POA: Insufficient documentation

## 2019-09-03 DIAGNOSIS — R55 Syncope and collapse: Secondary | ICD-10-CM | POA: Diagnosis present

## 2019-09-03 DIAGNOSIS — F419 Anxiety disorder, unspecified: Secondary | ICD-10-CM | POA: Diagnosis not present

## 2019-09-03 DIAGNOSIS — I10 Essential (primary) hypertension: Secondary | ICD-10-CM | POA: Diagnosis not present

## 2019-09-03 DIAGNOSIS — F17210 Nicotine dependence, cigarettes, uncomplicated: Secondary | ICD-10-CM | POA: Insufficient documentation

## 2019-09-03 HISTORY — DX: Aphasia: R47.01

## 2019-09-03 HISTORY — DX: Anxiety disorder, unspecified: F41.9

## 2019-09-03 LAB — COMPREHENSIVE METABOLIC PANEL
ALT: 15 U/L (ref 0–44)
AST: 15 U/L (ref 15–41)
Albumin: 4 g/dL (ref 3.5–5.0)
Alkaline Phosphatase: 51 U/L (ref 38–126)
Anion gap: 9 (ref 5–15)
BUN: 12 mg/dL (ref 6–20)
CO2: 24 mmol/L (ref 22–32)
Calcium: 8.8 mg/dL — ABNORMAL LOW (ref 8.9–10.3)
Chloride: 107 mmol/L (ref 98–111)
Creatinine, Ser: 0.79 mg/dL (ref 0.44–1.00)
GFR calc Af Amer: >60 mL/min (ref >60)
GFR calc non Af Amer: >60 mL/min (ref >60)
Glucose, Bld: 111 mg/dL — ABNORMAL HIGH (ref 70–99)
Potassium: 3.8 mmol/L (ref 3.5–5.1)
Sodium: 140 mmol/L (ref 135–145)
Total Bilirubin: 0.5 mg/dL (ref 0.3–1.2)
Total Protein: 6.9 g/dL (ref 6.5–8.1)

## 2019-09-03 LAB — CBC WITH DIFFERENTIAL/PLATELET
Abs Immature Granulocytes: 0.01 10*3/uL (ref 0.00–0.07)
Basophils Absolute: 0 10*3/uL (ref 0.0–0.1)
Basophils Relative: 0 %
Eosinophils Absolute: 0.1 10*3/uL (ref 0.0–0.5)
Eosinophils Relative: 1 %
HCT: 36.8 % (ref 36.0–46.0)
Hemoglobin: 11.5 g/dL — ABNORMAL LOW (ref 12.0–15.0)
Immature Granulocytes: 0 %
Lymphocytes Relative: 32 %
Lymphs Abs: 2.3 10*3/uL (ref 0.7–4.0)
MCH: 29.7 pg (ref 26.0–34.0)
MCHC: 31.3 g/dL (ref 30.0–36.0)
MCV: 95.1 fL (ref 80.0–100.0)
Monocytes Absolute: 0.4 10*3/uL (ref 0.1–1.0)
Monocytes Relative: 6 %
Neutro Abs: 4.4 10*3/uL (ref 1.7–7.7)
Neutrophils Relative %: 61 %
Platelets: 289 10*3/uL (ref 150–400)
RBC: 3.87 MIL/uL (ref 3.87–5.11)
RDW: 12.9 % (ref 11.5–15.5)
WBC: 7.2 10*3/uL (ref 4.0–10.5)
nRBC: 0 % (ref 0.0–0.2)

## 2019-09-03 LAB — TROPONIN I (HIGH SENSITIVITY)
Troponin I (High Sensitivity): <2 ng/L (ref <18)
Troponin I (High Sensitivity): <2 ng/L (ref <18)

## 2019-09-03 MED ORDER — SODIUM CHLORIDE 0.9 % IV BOLUS
1000.0000 mL | Freq: Once | INTRAVENOUS | Status: AC
  Administered 2019-09-03: 1000 mL via INTRAVENOUS

## 2019-09-03 NOTE — ED Provider Notes (Signed)
[facility] EMERGENCY DEPARTMENT Provider Note   CSN: [id number] Arrival date & time: 09/03/19  1537     History Chief Complaint  Patient presents with  . Near Syncope    Kara Brooks is a 39 y.o. female.  Patient was driving the car and felt dizzy and weak and anxious.  The history is provided by the patient.  Near Syncope This is a recurrent problem. The current episode started 1 to 2 hours ago. The problem occurs every several days. The problem has been resolved. Pertinent negatives include no chest pain, no abdominal pain and no headaches. Nothing aggravates the symptoms. She has tried nothing for the symptoms. The treatment provided no relief.       Past Medical History:  Diagnosis Date  . Anxiety   . Aphasia   . Endometriosis   . History of hidradenitis suppurativa 2016  . Hypertension     Patient Active Problem List   Diagnosis Date Noted  . Status post postpartum tubal ligation   . Chronic hypertension 11/18/2015  . IUGR (intrauterine growth restriction) affecting care of mother 11/11/2015  . Preeclampsia, severe 11/10/2015  . Gestational diabetes mellitus (GDM) in third trimester 10/14/2015  . Anemia affecting pregnancy 07/27/2015  . Supervision of high risk pregnancy in third trimester 05/17/2015  . AMA (advanced maternal age) multigravida 35+ 05/17/2015    Past Surgical History:  Procedure Laterality Date  . Laprascopy  2007  . Sweat gland removal  2016  . TUBAL LIGATION Bilateral 11/20/2015   Procedure: POST PARTUM TUBAL LIGATION;  Surgeon: Tereso Newcomer, MD;  Location: [facility];  Service: Gynecology;  Laterality: Bilateral;     OB History    Gravida  3   Para  3   Term  1   Preterm  2   AB      Living  3     SAB      TAB      Ectopic      Multiple  0   Live Births  3           Family History  Problem Relation Age of Onset  . Hypertension Mother   . Diabetes Mother   . Depression Mother     Social History    Tobacco Use  . Smoking status: Current Some Day Smoker    Packs/day: 0.25    Years: 15.00    Pack years: 3.75    Types: Cigarettes  . Smokeless tobacco: Never Used  Substance Use Topics  . Alcohol use: No  . Drug use: No    Home Medications Prior to Admission medications   Medication Sig Start Date End Date Taking? Authorizing Provider  clonazePAM (KLONOPIN) 0.5 MG tablet Take 0.5 mg by mouth daily.  07/27/19  Yes [provider]  escitalopram (LEXAPRO) 10 MG tablet Take 10 mg by mouth daily. 05/21/19  Yes [provider]  hydrochlorothiazide (HYDRODIURIL) 25 MG tablet Take 1 tablet (25 mg total) by mouth daily. 11/22/15  Yes Harraway-Smith, Eber Jones, MD  lisinopril (ZESTRIL) 5 MG tablet Take 5 mg by mouth daily. 05/04/19  Yes [provider]  Multiple Vitamins-Minerals (MULTIVITAMIN WITH MINERALS) tablet Take 1 tablet by mouth daily.   Yes [provider]  thiamine 50 MG tablet Take 50 mg by mouth daily.    Yes [provider]  calcium carbonate (OS-CAL) 1250 (500 Ca) MG chewable tablet Chew 1 tablet by mouth daily.    [provider]  docusate sodium (COLACE) 100 MG capsule Take 1 capsule (100 mg total) by mouth 2 (two) times daily as needed. Patient not taking: Reported on 12/23/2015 07/27/15   Clemmons, Elmore Guise, CNM  ferrous sulfate (FERROUSUL) 325 (65 FE) MG tablet Take 1 tablet (325 mg total) by mouth 2 (two) times daily. Patient not taking: Reported on 12/23/2015 07/27/15   Clemmons, Elmore Guise, CNM  HYDROcodone-acetaminophen (NORCO) 5-325 MG tablet Take 1 tablet by mouth every 6 (six) hours as needed for moderate pain. Patient not taking: Reported on 12/23/2015 12/15/15   Irean Hong, MD  ibuprofen (ADVIL,MOTRIN) 600 MG tablet Take 1 tablet (600 mg total) by mouth every 8 (eight) hours as needed. 01/23/17   Joni Reining, PA-C  ibuprofen (ADVIL,MOTRIN) 800 MG tablet Take 1 tablet (800 mg total) by mouth every 8 (eight) hours as needed for  moderate pain. 12/15/15   Irean Hong, MD  sertraline (ZOLOFT) 100 MG tablet Take 1 tablet (100 mg total) by mouth daily. 12/23/15   Constant, Peggy, MD  zolpidem (AMBIEN) 10 MG tablet Take 1 tablet (10 mg total) by mouth at bedtime as needed for sleep. 12/23/15 01/22/16  Constant, Peggy, MD    Allergies    Sulfa antibiotics, Buspirone, and Metronidazole  Review of Systems   Review of Systems  Constitutional: Positive for fatigue. Negative for appetite change.  HENT: Negative for congestion, ear discharge and sinus pressure.   Eyes: Negative for discharge.  Respiratory: Negative for cough.   Cardiovascular: Positive for near-syncope. Negative for chest pain.  Gastrointestinal: Negative for abdominal pain and diarrhea.  Genitourinary: Negative for frequency and hematuria.  Musculoskeletal: Negative for back pain.  Skin: Negative for rash.  Neurological: Negative for seizures and headaches.  Psychiatric/Behavioral: Negative for hallucinations.    Physical Exam Updated Vital Signs BP 140/86   Pulse (!) 105   Temp 99 F (37.2 C) (Oral)   Resp 11   Ht 5\' 6"  (1.676 m)   Wt 77.1 kg   LMP 09/01/2019   SpO2 99%   BMI 27.44 kg/m   Physical Exam Vitals and nursing note reviewed.  Constitutional:      Appearance: She is well-developed.  HENT:     Head: Normocephalic.     Nose: Nose normal.  Eyes:     General: No scleral icterus.    Conjunctiva/sclera: Conjunctivae normal.  Neck:     Thyroid: No thyromegaly.  Cardiovascular:     Rate and Rhythm: Normal rate and regular rhythm.     Heart sounds: No murmur. No friction rub. No gallop.   Pulmonary:     Breath sounds: No stridor. No wheezing or rales.  Chest:     Chest wall: No tenderness.  Abdominal:     General: There is no distension.     Tenderness: There is no abdominal tenderness. There is no rebound.  Musculoskeletal:        General: Normal range of motion.     Cervical back: Neck supple.  Lymphadenopathy:      Cervical: No cervical adenopathy.  Skin:    Findings: No erythema or rash.  Neurological:     Mental Status: She is alert and oriented to person, place, and time.     Motor: No abnormal muscle tone.     Coordination: Coordination normal.  Psychiatric:     Comments: Anxiety     ED Results / Procedures / Treatments   Labs (all labs ordered are listed, but only abnormal results  are displayed) Labs Reviewed  CBC WITH DIFFERENTIAL/PLATELET - Abnormal; Notable for the following components:      Result Value   Hemoglobin 11.5 (*)    All other components within normal limits  COMPREHENSIVE METABOLIC PANEL - Abnormal; Notable for the following components:   Glucose, Bld 111 (*)    Calcium 8.8 (*)    All other components within normal limits  TROPONIN I (HIGH SENSITIVITY)  TROPONIN I (HIGH SENSITIVITY)    EKG None  Radiology No results found.  Procedures Procedures (including critical care time)  Medications Ordered in ED Medications  sodium chloride 0.9 % bolus 1,000 mL (1,000 mLs Intravenous New Bag/Given 09/03/19 1713)    ED Course  I have reviewed the triage vital signs and the nursing notes.  Pertinent labs & imaging results that were available during my care of the patient were reviewed by me and considered in my medical decision making (see chart for details).    MDM Rules/Calculators/A&P                      Patient with panic attack.  Labs unremarkable she will follow-up with her PCP       This patient presents to the ED for concern of anxious dizzy this involves an extensive number of treatment options, and is a complaint that carries with it a high risk of complications and morbidity.  The differential diagnosis includes CVA anxiety   Lab Tests:   I Ordered, reviewed, and interpreted labs, which included CBC chemistries which showed mild anemia  Medicines ordered:   Patient was given normal saline fluids for dehydration Imaging Studies ordered:    Additional history obtained:   Additional history obtained from relative  Previous records obtained and reviewed   Consultations Obtained:    Reevaluation:  After the interventions stated above, I reevaluated the patient and found improved  Critical Interventions:  .   Final Clinical Impression(s) / ED Diagnoses Final diagnoses:  Anxiety    Rx / DC Orders ED Discharge Orders    None       Milton Ferguson, MD 09/03/19 1810

## 2019-09-03 NOTE — Discharge Instructions (Addendum)
Follow up with your doctor next week °
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
## 2019-09-03 NOTE — ED Notes (Signed)
Pt visitor at bedside.

## 2019-09-11 ENCOUNTER — Ambulatory Visit: Payer: BC Managed Care – PPO | Attending: Neurology | Admitting: Speech Pathology

## 2019-09-14 ENCOUNTER — Ambulatory Visit: Payer: BC Managed Care – PPO | Admitting: Speech Pathology

## 2019-09-16 ENCOUNTER — Encounter: Payer: BC Managed Care – PPO | Admitting: Speech Pathology

## 2019-09-23 ENCOUNTER — Encounter: Payer: BC Managed Care – PPO | Admitting: Speech Pathology

## 2019-09-25 ENCOUNTER — Encounter: Payer: BC Managed Care – PPO | Admitting: Speech Pathology

## 2019-09-28 ENCOUNTER — Encounter: Payer: BC Managed Care – PPO | Admitting: Speech Pathology

## 2019-09-30 ENCOUNTER — Encounter: Payer: BC Managed Care – PPO | Admitting: Speech Pathology

## 2019-10-05 ENCOUNTER — Encounter: Payer: BC Managed Care – PPO | Admitting: Speech Pathology

## 2019-10-07 ENCOUNTER — Encounter: Payer: BC Managed Care – PPO | Admitting: Speech Pathology

## 2019-10-14 ENCOUNTER — Encounter: Payer: BC Managed Care – PPO | Admitting: Speech Pathology

## 2019-10-16 ENCOUNTER — Encounter: Payer: BC Managed Care – PPO | Admitting: Speech Pathology

## 2020-06-19 ENCOUNTER — Emergency Department
Admission: EM | Admit: 2020-06-19 | Discharge: 2020-06-19 | Discharge status: Against Medical Advice | Payer: BC Managed Care – PPO | Attending: Emergency Medicine | Admitting: Emergency Medicine

## 2020-06-19 ENCOUNTER — Encounter: Payer: Self-pay | Admitting: Emergency Medicine

## 2020-06-19 ENCOUNTER — Other Ambulatory Visit: Payer: Self-pay

## 2020-06-19 DIAGNOSIS — F419 Anxiety disorder, unspecified: Secondary | ICD-10-CM | POA: Insufficient documentation

## 2020-06-19 DIAGNOSIS — Z5321 Procedure and treatment not carried out due to patient leaving prior to being seen by health care provider: Secondary | ICD-10-CM | POA: Diagnosis not present

## 2020-06-19 NOTE — ED Provider Notes (Signed)
Patient eloped prior to being seen.    Gilles Chiquito, MD 06/19/20 [phone]

## 2020-06-19 NOTE — ED Notes (Signed)
Pt still not in hallway bed.  Provider notified.

## 2020-06-19 NOTE — ED Triage Notes (Signed)
Pt to ED via [facility] from home for anxiety. Pt states that she has hx/o same. Pt is not currently taking medication for anxiety. Pt had panic attack earlier this morning and felt like she was going to pass out and her blood pressure was high. Pt is currently in NAD.

## 2020-06-19 NOTE — ED Triage Notes (Signed)
Pt in via EMS from home with c/o heart racing. EMS reports upon their arrival pt was hyperventilating and had some numbness and tingling in her fingers. Pt reports no concerns now and thinks she had a panic attack.  142/84

## 2020-06-19 NOTE — ED Notes (Signed)
Pt not in hallway bed 

## 2021-05-09 ENCOUNTER — Other Ambulatory Visit: Payer: Self-pay

## 2021-05-09 ENCOUNTER — Emergency Department
Admission: EM | Admit: 2021-05-09 | Discharge: 2021-05-09 | Disposition: A | Discharge status: Home / Self Care | Payer: 59 | Attending: Emergency Medicine | Admitting: Emergency Medicine

## 2021-05-09 DIAGNOSIS — I10 Essential (primary) hypertension: Secondary | ICD-10-CM | POA: Insufficient documentation

## 2021-05-09 DIAGNOSIS — M25532 Pain in left wrist: Secondary | ICD-10-CM | POA: Insufficient documentation

## 2021-05-09 MED ORDER — IBUPROFEN 400 MG PO TABS
400.0000 mg | ORAL_TABLET | Freq: Once | ORAL | Status: AC
  Administered 2021-05-09: 400 mg via ORAL
  Filled 2021-05-09: qty 1

## 2021-05-09 MED ORDER — IBUPROFEN 400 MG PO TABS: 400.0000 mg | ORAL_TABLET | Freq: Four times a day (QID) | ORAL | 0 refills | Status: AC | PRN

## 2021-05-09 NOTE — Discharge Instructions (Signed)
I suspect that you may have de Quervain's tenosynovitis or carpal tunnel syndrome.  Please take 400 mg of ibuprofen every 6 hours for pain and inflammation.  I would like you to purchase a thumb spica splint from the pharmacy which you can wear for comfort.  If your pain is not improving, please follow-up with your primary care provider as you may eventually need to be referred to an orthopedist.

## 2021-05-09 NOTE — ED Triage Notes (Signed)
Pt c/o left arm pain from elbow down into the hand since Saturday, denies injury. Pt is in nAD on arrival

## 2021-05-09 NOTE — ED Provider Notes (Signed)
[facility] Provider Note    Event Date/Time   First MD Initiated Contact with Patient 05/09/21 [phone]     (approximate)   History   Arm Pain   HPI  RHEMA LOYAL is a 41 y.o. female with past medical history of hidradenitis and hypertension who presents with left wrist and arm pain.  Symptoms started on Saturday when she pulled the covers she felt sudden onset of pain in the left thumb area radiating to the left elbow.  She bought a splint and has been wearing it but despite that pain has continued to worsen.  She denies any numbness or tingling in the fingers.  Does have pain in the first and second digit seems to radiate around the thumb region up to the elbow.  No preceding injury.  She denies history of similar.  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  Denies any other new potential exacerbating factors.  Denies any other joint pain.  No fevers or chills.  She is not taking anything for the pain.    Past Medical History:  Diagnosis Date   Anxiety    Aphasia    Endometriosis    History of hidradenitis suppurativa 2016   Hypertension     Patient Active Problem List   Diagnosis Date Noted   Status post postpartum tubal ligation    Chronic hypertension 11/18/2015   IUGR (intrauterine growth restriction) affecting care of mother 11/11/2015   Preeclampsia, severe 11/10/2015   Gestational diabetes mellitus (GDM) in third trimester 10/14/2015   Anemia affecting pregnancy 07/27/2015   Supervision of high risk pregnancy in third trimester 05/17/2015   AMA (advanced maternal age) multigravida 35+ 05/17/2015     Physical Exam  Triage Vital Signs: ED Triage Vitals  Enc Vitals Group     BP 05/09/21 0718 (!) 149/95     Pulse Rate 05/09/21 0718 91     Resp 05/09/21 0718 17     Temp 05/09/21 0718 98.4 F (36.9 C)     Temp Source 05/09/21 0718 Oral     SpO2 05/09/21 0718 100 %     Weight 05/09/21 0743 164 lb 14.5 oz (74.8 kg)      Height 05/09/21 0743 5\' 6"  (1.676 m)     Head Circumference --      Peak Flow --      Pain Score 05/09/21 0734 10     Pain Loc --      Pain Edu? --      Excl. in [place]? --     Most recent vital signs: Vitals:   05/09/21 0718  BP: (!) 149/95  Pulse: 91  Resp: 17  Temp: 98.4 F (36.9 C)  SpO2: 100%     General: Awake, no distress.  CV:  Good peripheral perfusion.  Resp:  Normal effort.  Abd:  No distention.  Neuro:             Awake, Alert, Oriented x 3  Other:  Tenderness to palpation along the first [facility] joint and the radial aspect of the wrist, no significant swelling or erythema, able to range the wrist without significant tenderness, pain with thumb abduction and ulnar deviation 2+ radial pulse   ED Results / Procedures / Treatments  Labs (all labs ordered are listed, but only abnormal results are displayed) Labs Reviewed - No data to display   EKG     RADIOLOGY    PROCEDURES:   MEDICATIONS  ORDERED IN ED: Medications  ibuprofen (ADVIL) tablet 400 mg (400 mg Oral Given 05/09/21 0803)     IMPRESSION / MDM / ASSESSMENT AND PLAN / ED COURSE  I reviewed the triage vital signs and the nursing notes.                              Differential diagnosis includes, but is not limited to, tendinitis, overuse syndrome, de Quervain's tenosynovitis, carpal tunnel syndrome   Patient is a 41 year old female presenting with atraumatic left wrist and arm pain.  There was no clear exacerbating factor she had no trauma.  On exam she is most tender over the first carpometacarpal joint and along the radial aspect of the wrist and she does have a positive Finkelstein test suggesting potential de Quervain's tenosynovitis.  There is no tenderness of the forearm or elbow.  She is neurovascularly intact.  Do not feel that imaging was necessary given there is no trauma.  Suspect either de Quervain's tenosynovitis versus carpal tunnel syndrome.  Less likely carpal tunnel as she does not  have any numbness or tingling.  We will treat with NSAIDs and a thumb spica splint.  Advised that she follow-up with her primary care provider if her symptoms or not improving.     FINAL CLINICAL IMPRESSION(S) / ED DIAGNOSES   Final diagnoses:  Left wrist pain     Rx / DC Orders   ED Discharge Orders          Ordered    ibuprofen (ADVIL) 400 MG tablet  Every 6 hours PRN        05/09/21 0759             Note:  This document was prepared using Dragon voice recognition software and may include unintentional dictation errors.   Rada Hay, MD 05/09/21 [phone]

## 2021-05-09 NOTE — ED Notes (Signed)
See triage note  presents with pain left arm  states pain is from elbow into hand  started couple of days ago  denies any known injury  no deformity  good pulses

## 2021-05-09 NOTE — ED Notes (Signed)
This RN reviewed paperwork with pt. No further complaints or questions. Pt ambulated to lobby. Discharge form placed in med rec box.  

## 2022-01-04 ENCOUNTER — Ambulatory Visit
Admission: EM | Admit: 2022-01-04 | Discharge: 2022-01-04 | Disposition: A | Discharge status: Home / Self Care | Payer: 59 | Attending: Emergency Medicine | Admitting: Emergency Medicine

## 2022-01-04 ENCOUNTER — Encounter: Payer: Self-pay | Admitting: Emergency Medicine

## 2022-01-04 DIAGNOSIS — B3731 Acute candidiasis of vulva and vagina: Secondary | ICD-10-CM | POA: Insufficient documentation

## 2022-01-04 DIAGNOSIS — Z202 Contact with and (suspected) exposure to infections with a predominantly sexual mode of transmission: Secondary | ICD-10-CM

## 2022-01-04 DIAGNOSIS — B9689 Other specified bacterial agents as the cause of diseases classified elsewhere: Secondary | ICD-10-CM | POA: Diagnosis present

## 2022-01-04 DIAGNOSIS — A5901 Trichomonal vulvovaginitis: Secondary | ICD-10-CM

## 2022-01-04 DIAGNOSIS — N760 Acute vaginitis: Secondary | ICD-10-CM

## 2022-01-04 LAB — URINALYSIS, MICROSCOPIC (REFLEX)

## 2022-01-04 LAB — URINALYSIS, ROUTINE W REFLEX MICROSCOPIC
Glucose, UA: NEGATIVE mg/dL
Hgb urine dipstick: NEGATIVE
Ketones, ur: NEGATIVE mg/dL
Nitrite: NEGATIVE
Protein, ur: 100 mg/dL — AB
Specific Gravity, Urine: >1.03 — ABNORMAL HIGH (ref 1.005–1.030)
pH: 5.5 (ref 5.0–8.0)

## 2022-01-04 LAB — WET PREP, GENITAL
Sperm: NONE SEEN
WBC, Wet Prep HPF POC: >10 — AB (ref <10)
Yeast Wet Prep HPF POC: NONE SEEN

## 2022-01-04 MED ORDER — CLINDAMYCIN HCL 300 MG PO CAPS: 300.0000 mg | ORAL_CAPSULE | Freq: Three times a day (TID) | ORAL | 0 refills | Status: AC

## 2022-01-04 MED ORDER — FLUCONAZOLE 150 MG PO TABS: 150.0000 mg | ORAL_TABLET | Freq: Every day | ORAL | 0 refills | Status: AC

## 2022-01-04 NOTE — ED Provider Notes (Signed)
[facility]    CSN: [id number] Arrival date & time: 01/04/22  1920      History   Chief Complaint Chief Complaint  Patient presents with   Vaginal Discharge   Exposure to STD    HPI Kara Brooks is a 41 y.o. female.   HPI  41 year old female here for evaluation of vaginal discharge.  Patient reports that she has been experiencing vaginal discharge that is yellow, mucousy, and has a fishy odor for the last 2 days.  She states that today she was experiencing a lot of discharge along with urinary urgency and frequency.  She states that she has been with her current partner for 3 years but had been hearing rumors that he was being unfaithful.  She is concerned that she may have an STI and is requesting testing tonight.  She denies any fever, pain with urination, blood in her urine, vaginal itching, low back pain, or abdominal pain.  Past Medical History:  Diagnosis Date   Anxiety    Aphasia    Endometriosis    History of hidradenitis suppurativa 2016   Hypertension     Patient Active Problem List   Diagnosis Date Noted   Status post postpartum tubal ligation    Chronic hypertension 11/18/2015   IUGR (intrauterine growth restriction) affecting care of mother 11/11/2015   Preeclampsia, severe 11/10/2015   Gestational diabetes mellitus (GDM) in third trimester 10/14/2015   Anemia affecting pregnancy 07/27/2015   Supervision of high risk pregnancy in third trimester 05/17/2015   AMA (advanced maternal age) multigravida 35+ 05/17/2015    Past Surgical History:  Procedure Laterality Date   Laprascopy  2007   Sweat gland removal  2016   TUBAL LIGATION Bilateral 11/20/2015   Procedure: POST PARTUM TUBAL LIGATION;  Surgeon: Tereso Newcomer, MD;  Location: [facility];  Service: Gynecology;  Laterality: Bilateral;    OB History     Gravida  3   Para  3   Term  1   Preterm  2   AB      Living  3      SAB      IAB      Ectopic      Multiple  0    Live Births  3            Home Medications    Prior to Admission medications   Medication Sig Start Date End Date Taking? Authorizing Provider  clindamycin (CLEOCIN) 300 MG capsule Take 1 capsule (300 mg total) by mouth 3 (three) times daily for 7 days. 01/04/22 01/11/22 Yes Becky Augusta, NP  fluconazole (DIFLUCAN) 150 MG tablet Take 1 tablet (150 mg total) by mouth daily for 2 doses. 01/04/22 01/06/22 Yes Becky Augusta, NP  atenolol (TENORMIN) 50 MG tablet Take 50 mg by mouth daily. 11/30/21   [provider]  clonazePAM (KLONOPIN) 0.5 MG tablet Take 0.5 mg by mouth daily.  07/27/19   [provider]  escitalopram (LEXAPRO) 10 MG tablet Take 10 mg by mouth daily. 05/21/19   [provider]  lisinopril (ZESTRIL) 5 MG tablet Take 5 mg by mouth daily. 05/04/19   [provider]  Multiple Vitamins-Minerals (MULTIVITAMIN WITH MINERALS) tablet Take 1 tablet by mouth daily.    [provider]  thiamine 50 MG tablet Take 50 mg by mouth daily.     [provider]  zolpidem (AMBIEN) 10 MG tablet Take 1 tablet (10 mg total) by mouth  at bedtime as needed for sleep. 12/23/15 01/22/16  Constant, Peggy, MD    Family History Family History  Problem Relation Age of Onset   Hypertension Mother    Diabetes Mother    Depression Mother     Social History Social History   Tobacco Use   Smoking status: Some Days    Packs/day: 0.25    Years: 15.00    Total pack years: 3.75    Types: Cigarettes   Smokeless tobacco: Never  Vaping Use   Vaping Use: Never used  Substance Use Topics   Alcohol use: No   Drug use: No     Allergies   Sulfa antibiotics, Buspirone, and Metronidazole   Review of Systems Review of Systems  Constitutional:  Negative for fever.  Gastrointestinal:  Negative for abdominal pain.  Genitourinary:  Positive for frequency, urgency, vaginal discharge and vaginal pain. Negative for dysuria.  Musculoskeletal:  Negative for  back pain.  Hematological: Negative.   Psychiatric/Behavioral: Negative.       Physical Exam Triage Vital Signs ED Triage Vitals  Enc Vitals Group     BP      Pulse      Resp      Temp      Temp src      SpO2      Weight      Height      Head Circumference      Peak Flow      Pain Score      Pain Loc      Pain Edu?      Excl. in [place]?    No data found.  Updated Vital Signs LMP 12/03/2021   Visual Acuity Right Eye Distance:   Left Eye Distance:   Bilateral Distance:    Right Eye Near:   Left Eye Near:    Bilateral Near:     Physical Exam Vitals and nursing note reviewed.  Constitutional:      Appearance: Normal appearance. She is not ill-appearing.  HENT:     Head: Normocephalic and atraumatic.  Cardiovascular:     Rate and Rhythm: Normal rate and regular rhythm.     Pulses: Normal pulses.     Heart sounds: Normal heart sounds. No murmur heard.    No friction rub. No gallop.  Pulmonary:     Effort: Pulmonary effort is normal.     Breath sounds: Normal breath sounds. No wheezing, rhonchi or rales.  Abdominal:     General: Abdomen is flat.     Palpations: Abdomen is soft.     Tenderness: There is no abdominal tenderness. There is no right CVA tenderness, left CVA tenderness, guarding or rebound.  Skin:    General: Skin is warm and dry.     Capillary Refill: Capillary refill takes less than 2 seconds.  Neurological:     General: No focal deficit present.     Mental Status: She is alert and oriented to person, place, and time.  Psychiatric:        Mood and Affect: Mood normal.        Behavior: Behavior normal.        Thought Content: Thought content normal.        Judgment: Judgment normal.      UC Treatments / Results  Labs (all labs ordered are listed, but only abnormal results are displayed) Labs Reviewed  WET PREP, GENITAL - Abnormal; Notable for the following components:  Result Value   Trich, Wet Prep PRESENT (*)    Clue Cells Wet Prep  HPF POC PRESENT (*)    WBC, Wet Prep HPF POC >10 (*)    All other components within normal limits  URINALYSIS, ROUTINE W REFLEX MICROSCOPIC - Abnormal; Notable for the following components:   APPearance HAZY (*)    Specific Gravity, Urine >1.030 (*)    Bilirubin Urine SMALL (*)    Protein, ur 100 (*)    Leukocytes,Ua TRACE (*)    All other components within normal limits  URINALYSIS, MICROSCOPIC (REFLEX) - Abnormal; Notable for the following components:   Bacteria, UA MANY (*)    Trichomonas, UA PRESENT (*)    All other components within normal limits  URINE CULTURE  CERVICOVAGINAL ANCILLARY ONLY    EKG   Radiology No results found.  Procedures Procedures (including critical care time)  Medications Ordered in UC Medications - No data to display  Initial Impression / Assessment and Plan / UC Course  I have reviewed the triage vital signs and the nursing notes.  Pertinent labs & imaging results that were available during my care of the patient were reviewed by me and considered in my medical decision making (see chart for details).   Patient is a very pleasant, nontoxic-appearing 41 year old female here for evaluation of yellow vaginal mucousy discharge with a fishlike odor and urinary urgency and frequency that been going on for the past 2 days.  No other associated symptoms.  She is concerned she may have an STI due to murmur she is heard about her sexual partner.  She denies any concern for pregnancy as she has had a tubal ligation.  Her last menstrual cycle was on 12/03/2021.  Patient exam reveals a benign cardiopulmonary exam with S1-S2 heart sounds are regular rate rhythm and lung sounds auscultation all fields.  Abdomen soft, flat, nontender.  No CVA tenderness on exam.  All of her urinalysis, vaginal wet prep, gonorrhea and Chlamydia testing.  Vaginal wet prep is positive for trichomonas and clue cells.  Is negative for yeast.  Urinalysis shows hazy appearance with a high  specific gravity, small bilirubin, 100 protein, trace leukocyte esterase.  The reflex microscopy shows 11-20 WBCs, many bacteria, trichomonas, and budding yeast.  Patient has an allergy to metronidazole, and after consultation with clinical key and consultation with [facility] health pharmacy they are unaware of any alternatives other the metronidazole and tonight is all for the treatment of trichomonas.  Patient does have an OB/GYN who she can contact.  She states that she wants to have the infection gone so she wants to go to the emergency department at [facility] to see if they will give her an IV dose of metronidazole and monitor her to see how she responds.  I will treat the bacterial vaginosis with clindamycin 300 mg 3 times daily x7 days and the yeast infection with Diflucan 150 mg now and repeat in 7 days after she is finished.  I am going to hold off on empirically treating the patient for gonorrhea chlamydia until after the test come back tomorrow.   Final Clinical Impressions(s) / UC Diagnoses   Final diagnoses:  Bacterial vaginosis  Trichomonas vaginalis (TV) infection  Yeast vaginitis  Possible exposure to STD     Discharge Instructions      You have tested positive for bacterial vaginosis and trichomonas on your vaginal swab.  There was also budding yeast present in your urine.  Take the  clindamycin 3 times a day for 7 days with food for treatment of your bacterial vaginosis.  Take the Diflucan 150 mg tablet, 1 tablet now and repeat in 7 days when you finished antibiotic therapy.  The trichomonas can only be treated with metronidazole or tonight is all which are in the same family and you have hives.  The only way to treat this would be to do do a trial of IV metronidazole in the hospital with close monitoring to see how you respond.  As we discussed, if you would like to have this infection dealt with tonight so you have elected to go to the hospital at [facility].  Your gonorrhea and Chlamydia test will back tomorrow and we will treat you if either of those test is positive.  You will be contacted by phone if your test results are positive otherwise they will just appear in your MyChart.     ED Prescriptions     Medication Sig Dispense Auth. Provider   clindamycin (CLEOCIN) 300 MG capsule Take 1 capsule (300 mg total) by mouth 3 (three) times daily for 7 days. 21 capsule Margarette Canada, NP   fluconazole (DIFLUCAN) 150 MG tablet Take 1 tablet (150 mg total) by mouth daily for 2 doses. 2 tablet Margarette Canada, NP      PDMP not reviewed this encounter.   Margarette Canada, NP 01/04/22 2011

## 2022-01-04 NOTE — Discharge Instructions (Addendum)
You have tested positive for bacterial vaginosis and trichomonas on your vaginal swab.  There was also budding yeast present in your urine.  Take the clindamycin 3 times a day for 7 days with food for treatment of your bacterial vaginosis.  Take the Diflucan 150 mg tablet, 1 tablet now and repeat in 7 days when you finished antibiotic therapy.  The trichomonas can only be treated with metronidazole or tonight is all which are in the same family and you have hives.  The only way to treat this would be to do do a trial of IV metronidazole in the hospital with close monitoring to see how you respond.  As we discussed, if you would like to have this infection dealt with tonight so you have elected to go to the hospital at [facility].  Your gonorrhea and Chlamydia test will back tomorrow and we will treat you if either of those test is positive.  You will be contacted by phone if your test results are positive otherwise they will just appear in your MyChart.

## 2022-01-04 NOTE — ED Triage Notes (Signed)
Pt presents with vaginal discharge x 2 days. Pt is also concerned for STD.

## 2022-01-05 LAB — CERVICOVAGINAL ANCILLARY ONLY
Chlamydia: NEGATIVE
Comment: NEGATIVE
Comment: NORMAL
Neisseria Gonorrhea: NEGATIVE

## 2022-01-06 LAB — URINE CULTURE

## 2022-02-06 ENCOUNTER — Other Ambulatory Visit: Payer: Self-pay

## 2022-02-06 ENCOUNTER — Emergency Department
Admission: EM | Admit: 2022-02-06 | Discharge: 2022-02-06 | Disposition: A | Discharge status: Home / Self Care | Payer: 59 | Attending: Emergency Medicine | Admitting: Emergency Medicine

## 2022-02-06 DIAGNOSIS — D72829 Elevated white blood cell count, unspecified: Secondary | ICD-10-CM | POA: Insufficient documentation

## 2022-02-06 DIAGNOSIS — R4701 Aphasia: Secondary | ICD-10-CM | POA: Diagnosis not present

## 2022-02-06 DIAGNOSIS — F419 Anxiety disorder, unspecified: Secondary | ICD-10-CM | POA: Diagnosis present

## 2022-02-06 DIAGNOSIS — I10 Essential (primary) hypertension: Secondary | ICD-10-CM | POA: Insufficient documentation

## 2022-02-06 LAB — BASIC METABOLIC PANEL
Anion gap: 5 (ref 5–15)
BUN: 13 mg/dL (ref 6–20)
CO2: 27 mmol/L (ref 22–32)
Calcium: 9.6 mg/dL (ref 8.9–10.3)
Chloride: 108 mmol/L (ref 98–111)
Creatinine, Ser: 0.84 mg/dL (ref 0.44–1.00)
GFR, Estimated: >60 mL/min (ref >60)
Glucose, Bld: 132 mg/dL — ABNORMAL HIGH (ref 70–99)
Potassium: 4.1 mmol/L (ref 3.5–5.1)
Sodium: 140 mmol/L (ref 135–145)

## 2022-02-06 LAB — CBC WITH DIFFERENTIAL/PLATELET
Abs Immature Granulocytes: 0.02 10*3/uL (ref 0.00–0.07)
Basophils Absolute: 0 10*3/uL (ref 0.0–0.1)
Basophils Relative: 0 %
Eosinophils Absolute: 0.2 10*3/uL (ref 0.0–0.5)
Eosinophils Relative: 2 %
HCT: 41.8 % (ref 36.0–46.0)
Hemoglobin: 13.8 g/dL (ref 12.0–15.0)
Immature Granulocytes: 0 %
Lymphocytes Relative: 48 %
Lymphs Abs: 5.1 10*3/uL — ABNORMAL HIGH (ref 0.7–4.0)
MCH: 30.6 pg (ref 26.0–34.0)
MCHC: 33 g/dL (ref 30.0–36.0)
MCV: 92.7 fL (ref 80.0–100.0)
Monocytes Absolute: 0.5 10*3/uL (ref 0.1–1.0)
Monocytes Relative: 5 %
Neutro Abs: 4.7 10*3/uL (ref 1.7–7.7)
Neutrophils Relative %: 45 %
Platelets: 325 10*3/uL (ref 150–400)
RBC: 4.51 MIL/uL (ref 3.87–5.11)
RDW: 12.2 % (ref 11.5–15.5)
WBC: 10.6 10*3/uL — ABNORMAL HIGH (ref 4.0–10.5)
nRBC: 0 % (ref 0.0–0.2)

## 2022-02-06 LAB — TSH: TSH: 2.658 u[IU]/mL (ref 0.350–4.500)

## 2022-02-06 LAB — TROPONIN I (HIGH SENSITIVITY): Troponin I (High Sensitivity): <2 ng/L (ref <18)

## 2022-02-06 NOTE — ED Notes (Signed)
Pt received discharge papers and verbalized understanding of discharge instructions. Pt ambulates to exit with steady gait accompanied by family member

## 2022-02-06 NOTE — ED Triage Notes (Signed)
Pt arrives to ED from home via PoV with c/c of anxiety and recurrent "panic attacks". Pt states she has a Hx of these panic attacks and is prescribed atenolol and zoloft which she states she has been taking as prescribed. Her Sx have increased and become unmanageable over the last 3 days, interfering with her ability to work and perform normal daily tasks. Pt denies any SI/HI. She states her lips are numb and her face is tingling. Pt A&Ox4, NAD.

## 2022-02-06 NOTE — Discharge Instructions (Addendum)
Your EKG and blood work including your cardiac enzymes and thyroid function tests were reassuring.  Please follow-up with your primary care doctor.  You can also follow-up at the [facility] for further resources and management of your anxiety.

## 2022-02-06 NOTE — ED Provider Notes (Signed)
[facility] Provider Note    Event Date/Time   First MD Initiated Contact with Patient 02/06/22 1834     (approximate)   History   Panic Attack   HPI  Kara Brooks is a 41 y.o. female with past medical history of anxiety hypertension who presents because of anxiety.  Patient tells me that she has been dealing with anxiety for many years.  Seems to be worse over the last several weeks.  Denies any acute stressors that she is aware of that could be exacerbating it.  She has been on Zoloft for several months prescribed by her primary care doctor which is not giving her much relief.  Over the last several days she has felt like she has been in a constant panic attack.  Feels like her heart is racing and she has difficulty catching her breath.  Also occasionally has difficulty speaking.  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  Patient has seen Dr. Melrose Nakayama with neurology in the past and it was thought that her speech difficulty is functional as it waxes and wanes.  Patient denies any new numbness tingling weakness vision change.  She denies feeling anxious currently.  Does feel somewhat dizzy which she describes it as spinning sensation.  Denies difficulty with speech.  Denies drug or alcohol use.     Past Medical History:  Diagnosis Date   Anxiety    Aphasia    Endometriosis    History of hidradenitis suppurativa 2016   Hypertension     Patient Active Problem List   Diagnosis Date Noted   Status post postpartum tubal ligation    Chronic hypertension 11/18/2015   IUGR (intrauterine growth restriction) affecting care of mother 11/11/2015   Preeclampsia, severe 11/10/2015   Gestational diabetes mellitus (GDM) in third trimester 10/14/2015   Anemia affecting pregnancy 07/27/2015   Supervision of high risk pregnancy in third trimester 05/17/2015   AMA (advanced maternal  age) multigravida 35+ 05/17/2015     Physical Exam  Triage Vital Signs: ED Triage Vitals  Enc Vitals Group     BP 02/06/22 1745 (!) 186/110     Pulse Rate 02/06/22 1745 84     Resp 02/06/22 1745 20     Temp 02/06/22 1745 98.1 F (36.7 C)     Temp Source 02/06/22 1745 Oral     SpO2 02/06/22 1745 99 %     Weight 02/06/22 1755 163 lb (73.9 kg)     Height 02/06/22 1755 5\' 6"  (1.676 m)     Head Circumference --      Peak Flow --      Pain Score 02/06/22 1754 7     Pain Loc --      Pain Edu? --      Excl. in [place]? --     Most recent vital signs: Vitals:   02/06/22 1745  BP: (!) 186/110  Pulse: 84  Resp: 20  Temp: 98.1 F (36.7 C)  SpO2: 99%     General: Awake, no distress.  CV:  Good peripheral perfusion.  Resp:  Normal effort.  Abd:  No distention.  Neuro:             Awake, Alert, Oriented x 3  Other:  Aox3, nml speech  PERRL, EOMI, face symmetric, nml tongue movement  5/5 strength in the BL upper  and lower extremities  Sensation grossly intact in the BL upper and lower extremities  Finger-nose-finger intact BL    ED Results / Procedures / Treatments  Labs (all labs ordered are listed, but only abnormal results are displayed) Labs Reviewed  CBC WITH DIFFERENTIAL/PLATELET - Abnormal; Notable for the following components:      Result Value   WBC 10.6 (*)    Lymphs Abs 5.1 (*)    All other components within normal limits  BASIC METABOLIC PANEL - Abnormal; Notable for the following components:   Glucose, Bld 132 (*)    All other components within normal limits  TSH  TROPONIN I (HIGH SENSITIVITY)  TROPONIN I (HIGH SENSITIVITY)     EKG  EKG interpretation performed by myself: NSR, nml axis, nml intervals, no acute ischemic changes    RADIOLOGY   PROCEDURES:  Critical Care performed: No  Procedures   MEDICATIONS ORDERED IN ED: Medications - No data to display   IMPRESSION / MDM / ASSESSMENT AND PLAN / ED COURSE  I reviewed the triage vital  signs and the nursing notes.                              Patient's presentation is most consistent with exacerbation of chronic illness.  Differential diagnosis includes, but is not limited to, exacerbation of underlying chronic anxiety, metabolic abnormality, hypothyroidism, CVA, ACS  The patient is a 41 year old female with history of hypertension chronic anxiety presents because of increasing anxiety.  This is a chronic issue for her her anxiety episodes are often associated with aphasia this has been documented by neurology and thought to be functional in the setting of her anxiety.  Over the last several days she has felt like she has been in a constant state of panic.  She is having some heart racing shortness of breath and the aphasia.  No other new neurologic symptoms.  She is hypertensive here but her vitals are otherwise reassuring.  She looks well on exam is resting comfortably does not appear to be having a panic episode currently.  Her neurologic exam is nonfocal and she has normal speech without aphasia.  EKG nonischemic.    Ultimately I think that patient would benefit from seeing a psychiatrist and having cognitive behavioral therapy.  May need to have her ended anxiety medication adjusted.  Do not think she needs any benzos for acute anxiety/panic currently if she is comfortable appearing.  Patient's troponin is negative.  TSH is within normal limits.  BMP reassuring.  CBC without any evidence of anemia.  Mild leukocytosis.  Feel that there is less likely to be underlying medical cause of her anxiety.     FINAL CLINICAL IMPRESSION(S) / ED DIAGNOSES   Final diagnoses:  Anxiety     Rx / DC Orders   ED Discharge Orders     None        Note:  This document was prepared using Dragon voice recognition software and may include unintentional dictation errors.   Georga Hacking, MD 02/06/22 2021

## 2022-02-06 NOTE — ED Provider Triage Note (Signed)
  Emergency Medicine Provider Triage Evaluation Note  ABCDE ONEIL , a 41 y.o.female,  was evaluated in triage.  Pt complains of anxiety/panic attacks.  Patient states that she is currently being treated for anxiety/depression was on Zoloft.  She has been off for the past few months.  However, she states that she persistently has pain attacks.  Over the past 3 days, it has been particularly worse.  She states she frequently gets heart palpitations and is persistently fatigued.  She states that her blood pressure has been extremely high as well.  Denies any other symptoms.   Review of Systems  Positive: Palpitations, hypertension, fatigue. Negative: Denies fever, chest pain, vomiting  Physical Exam   Vitals:   02/06/22 1745  BP: (!) 186/110  Pulse: 84  Resp: 20  Temp: 98.1 F (36.7 C)  SpO2: 99%   Gen:   Awake, tearful. Resp:  Normal effort  MSK:   Moves extremities without difficulty  Other:    Medical Decision Making  Given the patient's initial medical screening exam, the following diagnostic evaluation has been ordered. The patient will be placed in the appropriate treatment space, once one is available, to complete the evaluation and treatment. I have discussed the plan of care with the patient and I have advised the patient that an ED physician or mid-level practitioner will reevaluate their condition after the test results have been received, as the results may give them additional insight into the type of treatment they may need.    Diagnostics: Labs, EKG  Treatments: none immediately   Teodoro Spray, [place] 02/06/22 1750

## 2022-09-25 ENCOUNTER — Emergency Department
Admission: EM | Admit: 2022-09-25 | Discharge: 2022-09-26 | Disposition: A | Discharge status: Home / Self Care | Payer: 59 | Attending: Emergency Medicine | Admitting: Emergency Medicine

## 2022-09-25 DIAGNOSIS — Y92810 Car as the place of occurrence of the external cause: Secondary | ICD-10-CM | POA: Insufficient documentation

## 2022-09-25 DIAGNOSIS — S0181XA Laceration without foreign body of other part of head, initial encounter: Secondary | ICD-10-CM | POA: Diagnosis not present

## 2022-09-25 DIAGNOSIS — M542 Cervicalgia: Secondary | ICD-10-CM | POA: Insufficient documentation

## 2022-09-25 DIAGNOSIS — S0990XA Unspecified injury of head, initial encounter: Secondary | ICD-10-CM | POA: Diagnosis present

## 2022-09-26 ENCOUNTER — Emergency Department: Payer: 59

## 2022-09-26 ENCOUNTER — Other Ambulatory Visit: Payer: Self-pay

## 2022-09-26 MED ORDER — LIDOCAINE 4 % EX CREA
TOPICAL_CREAM | Freq: Once | CUTANEOUS | Status: AC
  Filled 2022-09-26: qty 5

## 2022-09-26 MED ORDER — LORAZEPAM 2 MG/ML IJ SOLN: 1.0000 mg | Freq: Once | INTRAMUSCULAR | Status: AC

## 2022-09-26 MED ORDER — IOHEXOL 350 MG/ML SOLN
75.0000 mL | Freq: Once | INTRAVENOUS | Status: AC | PRN
  Administered 2022-09-26: 75 mL via INTRAVENOUS

## 2022-09-26 MED ORDER — LORAZEPAM 2 MG/ML IJ SOLN
INTRAMUSCULAR | Status: AC
  Administered 2022-09-26: 1 mg via INTRAVENOUS
  Filled 2022-09-26: qty 1

## 2022-09-26 MED ORDER — KETOROLAC TROMETHAMINE 30 MG/ML IJ SOLN
15.0000 mg | Freq: Once | INTRAMUSCULAR | Status: AC
  Administered 2022-09-26: 15 mg via INTRAVENOUS
  Filled 2022-09-26: qty 1
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
## 2022-09-26 NOTE — Discharge Instructions (Signed)
Please take Tylenol and ibuprofen/Advil for your pain.  It is safe to take them together, or to alternate them every few hours.  Take up to 1000mg  of Tylenol at a time, up to 4 times per day.  Do not take more than 4000 mg of Tylenol in 24 hours.  For ibuprofen, take 400-600 mg, 3 - 4 times per day.  Gently wash the wound with soap and water.  It is okay to shower, but do not submerge in a bath or go swimming as it is healing.  Do not vigorously scrub.   Gently pat dry.   Once dry, then apply Neosporin or bacitracin or even Vaseline ointment to the area to act as a barrier to help prevent infection.  We placed 1 stitch that will absorb on its own
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
## 2024-06-02 ENCOUNTER — Ambulatory Visit: Admitting: Family Medicine
